# Patient Record
Sex: Male | Born: 1985
Health system: Southern US, Community
[De-identification: ages and names within clinical notes are randomized; demographics above are authoritative.]

## PROBLEM LIST (undated history)

## (undated) DIAGNOSIS — F17200 Nicotine dependence, unspecified, uncomplicated: Secondary | ICD-10-CM

## (undated) DIAGNOSIS — F419 Anxiety disorder, unspecified: Secondary | ICD-10-CM

## (undated) DIAGNOSIS — I10 Essential (primary) hypertension: Secondary | ICD-10-CM

## (undated) HISTORY — DX: Nicotine dependence, unspecified, uncomplicated: F17.200

## (undated) HISTORY — DX: Essential (primary) hypertension: I10

## (undated) HISTORY — PX: WISDOM TOOTH EXTRACTION: SHX21

---
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
## 2009-01-06 ENCOUNTER — Telehealth: Payer: Self-pay | Admitting: Family Medicine

## 2009-01-06 ENCOUNTER — Ambulatory Visit: Payer: Self-pay | Admitting: Family Medicine

## 2009-01-06 DIAGNOSIS — M545 Low back pain: Secondary | ICD-10-CM

## 2009-01-06 DIAGNOSIS — F17200 Nicotine dependence, unspecified, uncomplicated: Secondary | ICD-10-CM | POA: Insufficient documentation

## 2009-01-06 DIAGNOSIS — E669 Obesity, unspecified: Secondary | ICD-10-CM

## 2009-09-30 ENCOUNTER — Ambulatory Visit: Payer: Self-pay | Admitting: Family Medicine

## 2009-09-30 DIAGNOSIS — L723 Sebaceous cyst: Secondary | ICD-10-CM

## 2014-03-17 ENCOUNTER — Encounter: Payer: Self-pay | Admitting: Family Medicine

## 2014-03-17 ENCOUNTER — Telehealth: Payer: Self-pay | Admitting: Family Medicine

## 2014-03-17 ENCOUNTER — Ambulatory Visit (INDEPENDENT_AMBULATORY_CARE_PROVIDER_SITE_OTHER): Payer: BC Managed Care – PPO | Admitting: Family Medicine

## 2014-03-17 VITALS — BP 172/102 | HR 92 | Temp 98.0°F | Ht 71.5 in | Wt 264.2 lb

## 2014-03-17 DIAGNOSIS — R5383 Other fatigue: Secondary | ICD-10-CM

## 2014-03-17 DIAGNOSIS — R5381 Other malaise: Secondary | ICD-10-CM

## 2014-03-17 DIAGNOSIS — E669 Obesity, unspecified: Secondary | ICD-10-CM | POA: Insufficient documentation

## 2014-03-17 DIAGNOSIS — Z0000 Encounter for general adult medical examination without abnormal findings: Secondary | ICD-10-CM

## 2014-03-17 DIAGNOSIS — Z13220 Encounter for screening for lipoid disorders: Secondary | ICD-10-CM

## 2014-03-17 DIAGNOSIS — I10 Essential (primary) hypertension: Secondary | ICD-10-CM

## 2014-03-17 LAB — CBC WITH DIFFERENTIAL/PLATELET
Basophils Absolute: 0.1 10*3/uL (ref 0.0–0.1)
Basophils Relative: 0.6 % (ref 0.0–3.0)
Eosinophils Absolute: 0.4 10*3/uL (ref 0.0–0.7)
Eosinophils Relative: 3.9 % (ref 0.0–5.0)
HCT: 41 % (ref 39.0–52.0)
Hemoglobin: 13.8 g/dL (ref 13.0–17.0)
Lymphocytes Relative: 28.6 % (ref 12.0–46.0)
Lymphs Abs: 2.9 10*3/uL (ref 0.7–4.0)
MCHC: 33.7 g/dL (ref 30.0–36.0)
MCV: 85.7 fl (ref 78.0–100.0)
Monocytes Absolute: 0.6 10*3/uL (ref 0.1–1.0)
Monocytes Relative: 5.9 % (ref 3.0–12.0)
Neutro Abs: 6.3 10*3/uL (ref 1.4–7.7)
Neutrophils Relative %: 61 % (ref 43.0–77.0)
Platelets: 264 10*3/uL (ref 150.0–400.0)
RBC: 4.79 Mil/uL (ref 4.22–5.81)
RDW: 13.4 % (ref 11.5–14.6)
WBC: 10.3 10*3/uL (ref 4.5–10.5)

## 2014-03-17 LAB — BASIC METABOLIC PANEL
BUN: 16 mg/dL (ref 6–23)
CO2: 29 mEq/L (ref 19–32)
Calcium: 9.6 mg/dL (ref 8.4–10.5)
Chloride: 103 mEq/L (ref 96–112)
Creatinine, Ser: 1.3 mg/dL (ref 0.4–1.5)
GFR: 73.78 mL/min (ref >60.00)
Glucose, Bld: 130 mg/dL — ABNORMAL HIGH (ref 70–99)
Potassium: 4.6 mEq/L (ref 3.5–5.1)
Sodium: 139 mEq/L (ref 135–145)

## 2014-03-17 LAB — LIPID PANEL
Cholesterol: 148 mg/dL (ref 0–200)
HDL: 32.6 mg/dL — ABNORMAL LOW (ref >39.00)
LDL Cholesterol: 61 mg/dL (ref 0–99)
Total CHOL/HDL Ratio: 5
Triglycerides: 270 mg/dL — ABNORMAL HIGH (ref 0.0–149.0)
VLDL: 54 mg/dL — ABNORMAL HIGH (ref 0.0–40.0)

## 2014-03-17 LAB — HEPATIC FUNCTION PANEL
ALT: 23 U/L (ref 0–53)
AST: 32 U/L (ref 0–37)
Albumin: 4.5 g/dL (ref 3.5–5.2)
Alkaline Phosphatase: 86 U/L (ref 39–117)
Bilirubin, Direct: 0 mg/dL (ref 0.0–0.3)
Total Bilirubin: 0.4 mg/dL (ref 0.3–1.2)
Total Protein: 7.4 g/dL (ref 6.0–8.3)

## 2014-03-17 MED ORDER — LISINOPRIL-HYDROCHLOROTHIAZIDE 10-12.5 MG PO TABS: 1.0000 | ORAL_TABLET | Freq: Every day | ORAL | Status: DC

## 2014-03-17 NOTE — Progress Notes (Signed)
Date:  03/17/2014   Name:  Vincent Murray   DOB:  Jan 12, 1986   MRN:  [id number] Gender: male Age: 28 y.o.  Primary Physician:  Hannah Beat, MD   Chief Complaint: Establish Care   Subjective:   History of Present Illness:  Vincent Murray is a 28 y.o. pleasant patient who presents with the following:  Preventative Health Maintenance Visit:  Health Maintenance Summary Reviewed and updated, unless pt declines services.  Tobacco History Reviewed. Alcohol: No concerns, no excessive use Exercise Habits: rare STD concerns: no risk or activity to increase risk Drug Use: None Encouraged self-testicular check  Health Maintenance  Topic Date Due  . Tetanus/tdap  06/17/2006  . Influenza Vaccine  07/31/2013    Immunization History  Administered Date(s) Administered  . Td 12/31/2004   Blood pressure is high and has been high in high school. Smoking, stopped yesterday.  BP: 172/102 mmHg   Snores, denies daytime sleepiness  Patient Active Problem List   Diagnosis Date Noted  . Hypertension 03/17/2014  . Obesity (BMI 30-39.9) 03/17/2014  . SEBACEOUS CYST, NECK 09/30/2009  . OBESITY 01/06/2009  . TOBACCO USE 01/06/2009  . LOW BACK PAIN, CHRONIC 01/06/2009    No past medical history on file.  No past surgical history on file.  History   Social History  . Marital Status: Married    Spouse Name: N/A    Number of Children: N/A  . Years of Education: N/A   Occupational History  . Not on file.   Social History Main Topics  . Smoking status: Former Games developer  . Smokeless tobacco: Not on file     Comment: stopped smoking yesterday  . Alcohol Use: Yes     Comment: rare  . Drug Use: No  . Sexual Activity: Not on file   Other Topics Concern  . Not on file   Social History Narrative  . No narrative on file    Family History  Problem Relation Age of Onset  . Stroke Maternal Grandfather   . Heart disease Maternal Grandfather   . Hypertension Other      runs in family    Allergies  Allergen Reactions  . Penicillins     REACTION: rash (ALL CILLINS)    Medication list has been reviewed and updated.  Review of Systems:  General: Denies fever, chills, sweats. No significant weight loss. Eyes: Denies blurring,significant itching ENT: Denies earache, sore throat, and hoarseness. Cardiovascular: Denies chest pains, palpitations, dyspnea on exertion Respiratory: Denies cough, dyspnea at rest,wheeezing Breast: no concerns about lumps GI: Denies nausea, vomiting, diarrhea, constipation, change in bowel habits, abdominal pain, melena, hematochezia GU: Denies penile discharge, ED, urinary flow / outflow problems. No STD concerns. Musculoskeletal: recent back pain Derm: Denies rash, itching Neuro: Denies  paresthesias, frequent falls, frequent headaches Psych: Denies depression, anxiety Endocrine: Denies cold intolerance, heat intolerance, polydipsia Heme: Denies enlarged lymph nodes Allergy: No hayfever  Objective:   Physical Examination: BP 172/102  Pulse 92  Temp(Src) 98 F (36.7 C) (Oral)  Ht 5' 11.5" (1.816 m)  Wt 264 lb 4 oz (119.863 kg)  BMI 36.35 kg/m2  SpO2 97% Ideal Body Weight: Weight in (lb) to have BMI = 25: 181.4  GEN: well developed, well nourished, no acute distress Eyes: conjunctiva and lids normal, PERRLA, EOMI ENT: TM clear, nares clear, oral exam WNL Neck: supple, no lymphadenopathy, no thyromegaly, no JVD Pulm: clear to auscultation and percussion, respiratory effort normal CV: regular rate and rhythm, S1-S2,  no murmur, rub or gallop, no bruits, peripheral pulses normal and symmetric, no cyanosis, clubbing, edema or varicosities GI: soft, non-tender; no hepatosplenomegaly, masses; active bowel sounds all quadrants GU: defer Lymph: no cervical, axillary or inguinal adenopathy MSK: gait normal, muscle tone and strength WNL, no joint swelling, effusions, discoloration, crepitus  SKIN: clear, good turgor,  color WNL, no rashes, lesions, or ulcerations Neuro: normal mental status, normal strength, sensation, and motion Psych: alert; oriented to person, place and time, normally interactive and not anxious or depressed in appearance.  All labs pending  Assessment & Plan:   Health Maintenance Exam: The patient's preventative maintenance and recommended screening tests for an annual wellness exam were reviewed in full today. Brought up to date unless services declined.  Counselled on the importance of diet, exercise, and its role in overall health and mortality. The patient's FH and SH was reviewed, including their home life, tobacco status, and drug and alcohol status.  Routine general medical examination at a health care facility  Hypertension - Plan: Basic metabolic panel, US Renal Artery Stenosis  Screening for lipoid disorders - Plan: Lipid panel  Other malaise and fatigue - Plan: CBC with Differential, Hepatic function panel  Start BP meds Given high BP in young male, check U/S to eval for RAS  F/u 2 weeks  Orders Placed This Encounter  Procedures  . US Renal Artery Stenosis  . Basic metabolic panel  . CBC with Differential  . Hepatic function panel  . Lipid panel   Patient's Medications  New Prescriptions   LISINOPRIL-HYDROCHLOROTHIAZIDE (PRINZIDE,ZESTORETIC) 10-12.5 MG PER TABLET    Take 1 tablet by mouth daily.  Previous Medications   CLINDAMYCIN (CLEOCIN) 150 MG CAPSULE    Take 1 capsule by mouth 4 (four) times daily.   HAWTHORN PO    Take 1 tablet by mouth 4 (four) times daily.    HYDROCODONE-ACETAMINOPHEN (NORCO) 7.5-325 MG PER TABLET    Take 1 tablet by mouth as needed.   NON FORMULARY    Take 1 tablet by mouth daily. CREAFORM  Modified Medications   No medications on file  Discontinued Medications   No medications on file   Patient Instructions  REFERRALS TO SPECIALISTS, SPECIAL TESTS (MRI, CT, ULTRASOUNDS)  GO THE WAITING ROOM AND TELL CHECK IN YOU NEED HELP  WITH A REFERRAL. Either MARION or LINDA will help you set it up.  If it is between 1-2 PM they may be at lunch.  After 5 PM, they will likely be at home.  They will call you, so please make sure the office has your correct phone number.  Referrals sometimes can be done same day if urgent, but others can take 2 or 3 days to get an appointment. Starting in 2015, many of the new Medicare insurance plans and Affordable Care Act (Obamacare) Health plans offered take much longer for referrals. They have added additional paperwork and steps.  MRI's and CT's can take up to a week for the test. (Emergencies like strokes take precedence. I will tell you if you have an emergency.)   If [facility] [place] office, their office may contact you directly prior to our office reaching you.  -- Examples: [facility], [facility], [facility], [facility], [facility], and many more.  Specialist appointment times vary a great deal, mostly on the specialist's schedule and if they have openings. -- Our  office tries to get you in as fast as possible. -- Some specialists have very long wait times. (Example. Dermatology. Usually months) -- If you have a true emergency like new cancer, we work to get you in ASAP.     Signed,  Elpidio Galea. Tiearra Colwell, MD, CAQ Sports Medicine  [employer] at [facility] [address] [place] [address] Phone: [phone] Fax: [phone]

## 2014-03-17 NOTE — Patient Instructions (Signed)
REFERRALS TO SPECIALISTS, SPECIAL TESTS (MRI, CT, ULTRASOUNDS)  GO THE WAITING ROOM AND TELL CHECK IN YOU NEED HELP WITH A REFERRAL. Either MARION or LINDA will help you set it up.  If it is between 1-2 PM they may be at lunch.  After 5 PM, they will likely be at home.  They will call you, so please make sure the office has your correct phone number.  Referrals sometimes can be done same day if urgent, but others can take 2 or 3 days to get an appointment. Starting in 2015, many of the new Medicare insurance plans and Affordable Care Act (Obamacare) Health plans offered take much longer for referrals. They have added additional paperwork and steps.  MRI's and CT's can take up to a week for the test. (Emergencies like strokes take precedence. I will tell you if you have an emergency.)   If your referral is to an in-network [place] office, their office may contact you directly prior to our office reaching you.  -- Examples: [facility], [facility], [facility], [facility], [facility], and many more.  Specialist appointment times vary a great deal, mostly on the specialist's schedule and if they have openings. -- Our office tries to get you in as fast as possible. -- Some specialists have very long wait times. (Example. Dermatology. Usually months) -- If you have a true emergency like new cancer, we work to get you in ASAP.   

## 2014-03-17 NOTE — Addendum Note (Signed)
Addended by: Damita LackLORING, Ashby Leflore S on: 03/17/2014 12:09 PM   Modules accepted: Orders

## 2014-03-17 NOTE — Progress Notes (Signed)
Pre visit review using our clinic review tool, if applicable. No additional management support is needed unless otherwise documented below in the visit note. 

## 2014-03-17 NOTE — Telephone Encounter (Signed)
Relevant patient education mailed to patient.  

## 2014-03-25 ENCOUNTER — Encounter (INDEPENDENT_AMBULATORY_CARE_PROVIDER_SITE_OTHER): Payer: BC Managed Care – PPO

## 2014-03-25 DIAGNOSIS — I10 Essential (primary) hypertension: Secondary | ICD-10-CM

## 2014-03-31 ENCOUNTER — Encounter: Payer: Self-pay | Admitting: Family Medicine

## 2014-03-31 ENCOUNTER — Ambulatory Visit (INDEPENDENT_AMBULATORY_CARE_PROVIDER_SITE_OTHER): Payer: BC Managed Care – PPO | Admitting: Family Medicine

## 2014-03-31 VITALS — BP 146/88 | HR 70 | Temp 97.8°F | Ht 71.5 in | Wt 262.8 lb

## 2014-03-31 DIAGNOSIS — I10 Essential (primary) hypertension: Secondary | ICD-10-CM

## 2014-03-31 DIAGNOSIS — G4733 Obstructive sleep apnea (adult) (pediatric): Secondary | ICD-10-CM

## 2014-03-31 MED ORDER — LISINOPRIL-HYDROCHLOROTHIAZIDE 20-25 MG PO TABS: 1.0000 | ORAL_TABLET | Freq: Every day | ORAL | Status: DC

## 2014-03-31 NOTE — Patient Instructions (Signed)
REFERRALS TO SPECIALISTS, SPECIAL TESTS (MRI, CT, ULTRASOUNDS)  GO THE WAITING ROOM AND TELL CHECK IN YOU NEED HELP WITH A REFERRAL. Either MARION or LINDA will help you set it up.  If it is between 1-2 PM they may be at lunch.  After 5 PM, they will likely be at home.  They will call you, so please make sure the office has your correct phone number.  Referrals sometimes can be done same day if urgent, but others can take 2 or 3 days to get an appointment. Starting in 2015, many of the new Medicare insurance plans and Affordable Care Act (Obamacare) Health plans offered take much longer for referrals. They have added additional paperwork and steps.  MRI's and CT's can take up to a week for the test. (Emergencies like strokes take precedence. I will tell you if you have an emergency.)   If your referral is to an in-network [place] office, their office may contact you directly prior to our office reaching you.  -- Examples: [facility], [facility], [facility], [facility], [facility], and many more.  Specialist appointment times vary a great deal, mostly on the specialist's schedule and if they have openings. -- Our office tries to get you in as fast as possible. -- Some specialists have very long wait times. (Example. Dermatology. Usually months) -- If you have a true emergency like new cancer, we work to get you in ASAP.   

## 2014-03-31 NOTE — Progress Notes (Signed)
Date:  03/31/2014   Name:  Vincent Murray   DOB:  1986/06/01   MRN:  [id number]  Primary Physician:  Hannah Beat, MD   Chief Complaint: Hypertension   Subjective:   History of Present Illness:  Vincent Murray is a 28 y.o. very pleasant male patient who presents with the following:  HTN: Tolerating all medications without side effects Improved, not at goal No CP, no sob. No HA.  BP Readings from Last 3 Encounters:  03/31/14 146/88  03/17/14 172/102  09/30/09 128/76    Basic Metabolic Panel:    Component Value Date/Time   NA 139 03/17/2014 0855   K 4.6 03/17/2014 0855   CL 103 03/17/2014 0855   CO2 29 03/17/2014 0855   BUN 16 03/17/2014 0855   CREATININE 1.3 03/17/2014 0855   GLUCOSE 130* 03/17/2014 0855   CALCIUM 9.6 03/17/2014 0855    Still tired a lot.  Still ? Stops breathing. Snores a lot. Girlfriend notes that he stops breathing at night.  Tired most of the time.  Past Medical History, Surgical History, Social History, Family History, Problem List, Medications, and Allergies have been reviewed and updated if relevant.  Review of Systems:  GEN: No acute illnesses, no fevers, chills. GI: No n/v/d, eating normally Pulm: No SOB Interactive and getting along well at home.  Otherwise, ROS is as per the HPI.  Objective:   Physical Examination: BP 146/88  Pulse 70  Temp(Src) 97.8 F (36.6 C) (Oral)  Ht 5' 11.5" (1.816 m)  Wt 262 lb 12 oz (119.183 kg)  BMI 36.14 kg/m2   GEN: WDWN, NAD, Non-toxic, A & O x 3 HEENT: Atraumatic, Normocephalic. Neck supple. No masses, No LAD. Ears and Nose: No external deformity. CV: RRR, No M/G/R. No JVD. No thrill. No extra heart sounds. PULM: CTA B, no wheezes, crackles, rhonchi. No retractions. No resp. distress. No accessory muscle use. EXTR: No c/c/e NEURO Normal gait.  PSYCH: Normally interactive. Conversant. Not depressed or anxious appearing.  Calm demeanor.   Laboratory and Imaging Data:  Assessment &  Plan:   Hypertension  Obstructive sleep apnea - Plan: Ambulatory referral to Pulmonology  Blood pressure is stabilizing, but it is still not at goal. Increase Zestoretic dose.  History is suggestive of obstructive sleep apnea. Witnessed apneic events by his girlfriend, and snoring heavily in a patient with a BMI of 36.  Return in about 3 weeks (around 04/21/2014).  Orders Placed This Encounter  Procedures  . Ambulatory referral to Pulmonology   Patient's Medications  New Prescriptions   LISINOPRIL-HYDROCHLOROTHIAZIDE (PRINZIDE,ZESTORETIC) 20-25 MG PER TABLET    Take 1 tablet by mouth daily.  Previous Medications   HAWTHORN PO    Take 1 tablet by mouth 4 (four) times daily.    HYDROCODONE-ACETAMINOPHEN (NORCO) 7.5-325 MG PER TABLET    Take 1 tablet by mouth as needed.   NON FORMULARY    Take 1 tablet by mouth daily. CREAFORM  Modified Medications   No medications on file  Discontinued Medications   CLINDAMYCIN (CLEOCIN) 150 MG CAPSULE    Take 1 capsule by mouth 4 (four) times daily.   LISINOPRIL-HYDROCHLOROTHIAZIDE (PRINZIDE,ZESTORETIC) 10-12.5 MG PER TABLET    Take 1 tablet by mouth daily.   Patient Instructions  REFERRALS TO SPECIALISTS, SPECIAL TESTS (MRI, CT, ULTRASOUNDS)  GO THE WAITING ROOM AND TELL CHECK IN YOU NEED HELP WITH A REFERRAL. Either MARION or LINDA will help you set it up.  If it is between  1-2 PM they may be at lunch.  After 5 PM, they will likely be at home.  They will call you, so please make sure the office has your correct phone number.  Referrals sometimes can be done same day if urgent, but others can take 2 or 3 days to get an appointment. Starting in 2015, many of the new Medicare insurance plans and Affordable Care Act (Obamacare) Health plans offered take much longer for referrals. They have added additional paperwork and steps.  MRI's and CT's can take up to a week for the test. (Emergencies like strokes take precedence. I will tell you if you  have an emergency.)   If your referral [facility] to an [facility] [place] office, their office may contact you directly prior to our office reaching you.  -- Examples: [facility], [place] [facility], [facility], [facility], [facility], and many more.  Specialist appointment times vary a great deal, mostly on the specialist's schedule and if they have openings. -- Our office tries to get you in as fast as possible. -- Some specialists have very long wait times. (Example. Dermatology. Usually months) -- If you have a true emergency like new cancer, we work to get you in ASAP.     Signed,  Elpidio GaleaSpencer T. Jady Braggs, MD, CAQ Sports Medicine  [facility] [address] [place] [address] Phone: [phone] Fax: [phone]

## 2014-03-31 NOTE — Progress Notes (Signed)
Pre visit review using our clinic review tool, if applicable. No additional management support is needed unless otherwise documented below in the visit note. 

## 2014-04-26 ENCOUNTER — Telehealth: Payer: Self-pay | Admitting: Family Medicine

## 2014-04-26 ENCOUNTER — Ambulatory Visit (INDEPENDENT_AMBULATORY_CARE_PROVIDER_SITE_OTHER): Payer: BC Managed Care – PPO | Admitting: Family Medicine

## 2014-04-26 ENCOUNTER — Encounter: Payer: Self-pay | Admitting: Family Medicine

## 2014-04-26 VITALS — BP 136/70 | HR 84 | Temp 98.2°F | Wt 256.0 lb

## 2014-04-26 DIAGNOSIS — N529 Male erectile dysfunction, unspecified: Secondary | ICD-10-CM

## 2014-04-26 DIAGNOSIS — T50904A Poisoning by unspecified drugs, medicaments and biological substances, undetermined, initial encounter: Secondary | ICD-10-CM

## 2014-04-26 DIAGNOSIS — N522 Drug-induced erectile dysfunction: Secondary | ICD-10-CM

## 2014-04-26 DIAGNOSIS — I10 Essential (primary) hypertension: Secondary | ICD-10-CM

## 2014-04-26 MED ORDER — AMLODIPINE BESYLATE 5 MG PO TABS: 5.0000 mg | ORAL_TABLET | Freq: Every day | ORAL | Status: DC

## 2014-04-26 MED ORDER — LISINOPRIL 40 MG PO TABS: 40.0000 mg | ORAL_TABLET | Freq: Every day | ORAL | Status: DC

## 2014-04-26 NOTE — Progress Notes (Signed)
[address] [place] [address] Phone: [phone] Fax: [phone]  Patient ID: Vincent Murray MRN: [id number], DOB: [birth year], 28 y.o. Date of Encounter: 04/26/2014  Primary Physician:  Hannah BeatSpencer Bekim Werntz, MD   Chief Complaint: Follow-up   Subjective:   History of Present Illness:  Vincent Murray is a 28 y.o. very pleasant male patient who presents with the following:  HTN: Tolerating all medications but he is having some ED that started right after increasing her meds.  Stable and at goal No CP, no sob. No HA.  BP Readings from Last 3 Encounters:  04/26/14 136/70  03/31/14 146/88  03/17/14 172/102    Basic Metabolic Panel:    Component Value Date/Time   NA 139 03/17/2014 0855   K 4.6 03/17/2014 0855   CL 103 03/17/2014 0855   CO2 29 03/17/2014 0855   BUN 16 03/17/2014 0855   CREATININE 1.3 03/17/2014 0855   GLUCOSE 130* 03/17/2014 0855   CALCIUM 9.6 03/17/2014 0855    After we doubled the dose of the lisinopril hydrochlorothiazide, the patient was unable to achieve an erection, which has never been a problem for him in the past.  Past Medical History, Surgical History, Social History, Family History, Problem List, Medications, and Allergies have been reviewed and updated if relevant.  Review of Systems:  GEN: No acute illnesses, no fevers, chills. GI: No n/v/d, eating normally Pulm: No SOB Interactive and getting along well at home.  Otherwise, ROS is as per the HPI.  Objective:   Physical Examination: BP 136/70  Pulse 84  Temp(Src) 98.2 F (36.8 C) (Oral)  Wt 256 lb (116.121 kg)  SpO2 99%   GEN: WDWN, NAD, Non-toxic, A & O x 3 HEENT: Atraumatic, Normocephalic. Neck supple. No masses, No LAD. Ears and Nose: No external deformity. CV: RRR, No M/G/R. No JVD. No thrill. No extra heart sounds. PULM: CTA B, no wheezes, crackles, rhonchi. No retractions. No resp. distress. No accessory muscle use. EXTR: No c/c/e NEURO Normal gait.  PSYCH:  Normally interactive. Conversant. Not depressed or anxious appearing.  Calm demeanor.   Laboratory and Imaging Data: Results for orders placed in visit on 03/17/14  BASIC METABOLIC PANEL      Result Value Ref Range   Sodium 139  135 - 145 mEq/L   Potassium 4.6  3.5 - 5.1 mEq/L   Chloride 103  96 - 112 mEq/L   CO2 29  19 - 32 mEq/L   Glucose, Bld 130 (*) 70 - 99 mg/dL   BUN 16  6 - 23 mg/dL   Creatinine, Ser 1.3  0.4 - 1.5 mg/dL   Calcium 9.6  8.4 - 13.210.5 mg/dL   GFR 44.0173.78  >02.72>60.00 mL/min  CBC WITH DIFFERENTIAL      Result Value Ref Range   WBC 10.3  4.5 - 10.5 K/uL   RBC 4.79  4.22 - 5.81 Mil/uL   Hemoglobin 13.8  13.0 - 17.0 g/dL   HCT 53.641.0  64.439.0 - 03.452.0 %   MCV 85.7  78.0 - 100.0 fl   MCHC 33.7  30.0 - 36.0 g/dL   RDW 74.213.4  59.511.5 - 63.814.6 %   Platelets 264.0  150.0 - 400.0 K/uL   Neutrophils Relative % 61.0  43.0 - 77.0 %   Lymphocytes Relative 28.6  12.0 - 46.0 %   Monocytes Relative 5.9  3.0 - 12.0 %   Eosinophils Relative 3.9  0.0 - 5.0 %   Basophils Relative 0.6  0.0 - 3.0 %   Neutro Abs 6.3  1.4 - 7.7 K/uL   Lymphs Abs 2.9  0.7 - 4.0 K/uL   Monocytes Absolute 0.6  0.1 - 1.0 K/uL   Eosinophils Absolute 0.4  0.0 - 0.7 K/uL   Basophils Absolute 0.1  0.0 - 0.1 K/uL  HEPATIC FUNCTION PANEL      Result Value Ref Range   Total Bilirubin 0.4  0.3 - 1.2 mg/dL   Bilirubin, Direct 0.0  0.0 - 0.3 mg/dL   Alkaline Phosphatase 86  39 - 117 U/L   AST 32  0 - 37 U/L   ALT 23  0 - 53 U/L   Total Protein 7.4  6.0 - 8.3 g/dL   Albumin 4.5  3.5 - 5.2 g/dL  LIPID PANEL      Result Value Ref Range   Cholesterol 148  0 - 200 mg/dL   Triglycerides 161.0270.0 (*) 0.0 - 149.0 mg/dL   HDL 96.0432.60 (*) >54.09>39.00 mg/dL   VLDL 81.154.0 (*) 0.0 - 91.440.0 mg/dL   LDL Cholesterol 61  0 - 99 mg/dL   Total CHOL/HDL Ratio 5       Assessment & Plan:   Hypertension  Drug-induced erectile dysfunction  Discontinue hydrochlorothiazide and increase lisinopril to 40 mg a day. Likely the diuretic Clinoril in his  erectile dysfunction. There would not be any other obvious cause.  Follow-up: Return in about 1 month (around 05/26/2014).  New Prescriptions   AMLODIPINE (NORVASC) 5 MG TABLET    Take 1 tablet (5 mg total) by mouth daily.   LISINOPRIL (PRINIVIL,ZESTRIL) 40 MG TABLET    Take 1 tablet (40 mg total) by mouth daily.   No orders of the defined types were placed in this encounter.    Signed,  Elpidio GaleaSpencer T. Nilay Mangrum, MD, CAQ Sports Medicine  Patient's Medications  New Prescriptions   AMLODIPINE (NORVASC) 5 MG TABLET    Take 1 tablet (5 mg total) by mouth daily.   LISINOPRIL (PRINIVIL,ZESTRIL) 40 MG TABLET    Take 1 tablet (40 mg total) by mouth daily.  Previous Medications   HYDROCODONE-ACETAMINOPHEN (NORCO) 7.5-325 MG PER TABLET    Take 1 tablet by mouth as needed.   NON FORMULARY    Take 1 tablet by mouth daily. CREAFORM  Modified Medications   No medications on file  Discontinued Medications   HAWTHORN PO    Take 1 tablet by mouth 4 (four) times daily.    LISINOPRIL-HYDROCHLOROTHIAZIDE (PRINZIDE,ZESTORETIC) 20-25 MG PER TABLET    Take 1 tablet by mouth daily.

## 2014-04-26 NOTE — Telephone Encounter (Signed)
Relevant patient education mailed to patient.  

## 2014-04-26 NOTE — Progress Notes (Signed)
Pre visit review using our clinic review tool, if applicable. No additional management support is needed unless otherwise documented below in the visit note. 

## 2014-05-07 ENCOUNTER — Ambulatory Visit (INDEPENDENT_AMBULATORY_CARE_PROVIDER_SITE_OTHER): Payer: BC Managed Care – PPO | Admitting: Internal Medicine

## 2014-05-07 ENCOUNTER — Encounter: Payer: Self-pay | Admitting: Internal Medicine

## 2014-05-07 VITALS — BP 130/78 | HR 71 | Ht 71.0 in | Wt 263.4 lb

## 2014-05-07 DIAGNOSIS — G4733 Obstructive sleep apnea (adult) (pediatric): Secondary | ICD-10-CM | POA: Insufficient documentation

## 2014-05-07 DIAGNOSIS — F17200 Nicotine dependence, unspecified, uncomplicated: Secondary | ICD-10-CM

## 2014-05-07 DIAGNOSIS — E669 Obesity, unspecified: Secondary | ICD-10-CM

## 2014-05-07 DIAGNOSIS — I10 Essential (primary) hypertension: Secondary | ICD-10-CM

## 2014-05-07 NOTE — Assessment & Plan Note (Signed)
"  Stopped yesterday"  Encouraged to stop permanently

## 2014-05-07 NOTE — Assessment & Plan Note (Signed)
High probability. The concern of his primary physician is that OSA may contribute to HBP. We discussed the physiology and medical concerns, importance of weight, and responsibility to drive safely, importance of good sleep habits. Plan- Unattended home sleep study if insurance agrees, otherwise overnight split study at sleep center.

## 2014-05-07 NOTE — Assessment & Plan Note (Signed)
Managed by PCP

## 2014-05-07 NOTE — Progress Notes (Signed)
05/07/14- 27 yoM smoker Follows for :  sleep consult per Dr. Copland---pt snoring at night, apnea when lying on his back Snored more loudly before he lost 20 lbs working out. Worse on his back. Oppressive daytime tiredness. If he can nap, he sleeps hard for an hour and is grumpy rest of day. No ENT surgery. Concern is basis for his hypertension. May talk in sleep, but no movement disturbance. Asthma as child. No other cardiopulmonary problem.Bedtime 10-12 PM, latency 10-60 min, waking 0-1 before up at 8-8:30 AM.  Prior to Admission medications   Medication Sig Start Date End Date Taking? Authorizing Provider  amLODipine (NORVASC) 5 MG tablet Take 1 tablet (5 mg total) by mouth daily. 04/26/14  Yes Spencer Copland, MD  HYDROcodone-acetaminophen (NORCO) 7.5-325 MG per tablet Take 1 tablet by mouth as needed. 03/10/14  Yes Historical Provider, MD  lisinopril (PRINIVIL,ZESTRIL) 40 MG tablet Take 1 tablet (40 mg total) by mouth daily. 04/26/14  Yes Hannah BeatSpencer Copland, MD  NON FORMULARY Take 1 tablet by mouth daily. CREAFORM   Yes Historical Provider, MD   Past Medical History  Diagnosis Date  . Hypertension   . Smoker    History reviewed. No pertinent past surgical history. Family History  Problem Relation Age of Onset  . Stroke Maternal Grandfather   . Heart disease Maternal Grandfather   . Hypertension Other     runs in family   History   Social History  . Marital Status: Married    Spouse Name: Aundra MilletMegan     Number of Children: N/A  . Years of Education: N/A   Occupational History  .     Social History Main Topics  . Smoking status: Current Some Day Smoker -- 1.00 packs/day    Types: Cigarettes  . Smokeless tobacco: Former NeurosurgeonUser     Comment: stopped smoking yesterday  . Alcohol Use: Yes     Comment: rare  . Drug Use: No  . Sexual Activity: Not on file   Other Topics Concern  . Not on file   Social History Narrative  . No narrative on file   ROS-see HPI Constitutional:   +weight  loss, No-night sweats, fevers, chills,+ fatigue, lassitude. HEENT:   No-  headaches, difficulty swallowing, tooth/dental problems, sore throat,       No-  sneezing, itching, ear ache, nasal congestion, post nasal drip,  CV:  No-   chest pain, orthopnea, PND, swelling in lower extremities, anasarca,                                  dizziness, palpitations Resp: No-   shortness of breath with exertion or at rest.              No-   productive cough,  No non-productive cough,  No- coughing up of blood.              No-   change in color of mucus.  No- wheezing.   Skin: No-   rash or lesions. GI:  No-   heartburn, indigestion, abdominal pain, nausea, vomiting, diarrhea,                 change in bowel habits, loss of appetite GU: No-   dysuria, change in color of urine, no urgency or frequency.  No- flank pain. MS:  No-   joint pain or swelling.  No- decreased range of motion.  No-  back pain. Neuro-     nothing unusual Psych:  No- change in mood or affect. No depression or anxiety.  No memory loss.  OBJ- Physical Exam General- Alert, Oriented, Affect-appropriate, Distress- none acute, +stocky/ muscular Skin- rash-none, lesions- none, excoriation- none Lymphadenopathy- none Head- atraumatic            Eyes- Gross vision intact, PERRLA, conjunctivae and secretions clear            Ears- Hearing, canals-normal            Nose- Clear, no-Septal dev, mucus, polyps, erosion, perforation             Throat- Mallampati II-III , mucosa clear , drainage- none, tonsils- atrophic Neck- flexible , trachea midline, no stridor , thyroid nl, carotid no bruit Chest - symmetrical excursion , unlabored           Heart/CV- RRR , no murmur , no gallop  , no rub, nl s1 s2                           - JVD- none , edema- none, stasis changes- none, varices- none           Lung- clear to P&A, wheeze- none, cough- none , dullness-none, rub- none           Chest wall-  Abd- tender-no, distended-no, bowel  sounds-present, HSM- no Br/ Gen/ Rectal- Not done, not indicated Extrem- cyanosis- none, clubbing, none, atrophy- none, strength- nl Neuro- grossly intact to observation

## 2014-05-07 NOTE — Patient Instructions (Signed)
Order- Home unattended sleep study    Dx OSA  We will go over results at the return visit

## 2014-05-07 NOTE — Assessment & Plan Note (Signed)
Over best weight- not all muscle

## 2014-05-26 ENCOUNTER — Ambulatory Visit (INDEPENDENT_AMBULATORY_CARE_PROVIDER_SITE_OTHER): Payer: BC Managed Care – PPO | Admitting: Family Medicine

## 2014-05-26 ENCOUNTER — Encounter: Payer: Self-pay | Admitting: Family Medicine

## 2014-05-26 VITALS — BP 130/76 | HR 59 | Temp 98.2°F | Ht 71.5 in | Wt 262.5 lb

## 2014-05-26 DIAGNOSIS — M25519 Pain in unspecified shoulder: Secondary | ICD-10-CM

## 2014-05-26 DIAGNOSIS — I10 Essential (primary) hypertension: Secondary | ICD-10-CM

## 2014-05-26 DIAGNOSIS — M25512 Pain in left shoulder: Secondary | ICD-10-CM

## 2014-05-26 NOTE — Progress Notes (Signed)
[address] [place] [address] Phone: [phone] Fax: [phone]  Patient ID: Vincent Murray MRN: [id number], DOB: 03-13-1986, 28 y.o. Date of Encounter: 05/26/2014  Primary Physician:  Hannah Beat, MD   Chief Complaint: Follow-up and Shoulder Pain   Subjective:   History of Present Illness:  Vincent Murray is a 28 y.o. very pleasant male patient who presents with the following:  HTN: Tolerating all medications without side effects Stable and at goal No CP, no sob. No HA.  BP Readings from Last 3 Encounters:  05/26/14 130/76  05/07/14 130/78  04/26/14 136/70    Basic Metabolic Panel:    Component Value Date/Time   NA 139 03/17/2014 0855   K 4.6 03/17/2014 0855   CL 103 03/17/2014 0855   CO2 29 03/17/2014 0855   BUN 16 03/17/2014 0855   CREATININE 1.3 03/17/2014 0855   GLUCOSE 130* 03/17/2014 0855   CALCIUM 9.6 03/17/2014 0855    ED issues improved off HCTZ.   L shoulder, hurting it last Wednesday and it started to hurt really bad. And every once in a while it will catch on the lateral neck. Started to feel better, and felt ok with lifting felt a pop with incline flies.   Past Medical History, Surgical History, Social History, Family History, Problem List, Medications, and Allergies have been reviewed and updated if relevant.  Review of Systems:  GEN: No acute illnesses, no fevers, chills. GI: No n/v/d, eating normally Pulm: No SOB Interactive and getting along well at home.  Otherwise, ROS is as per the HPI.  Objective:   Physical Examination: BP 130/76  Pulse 59  Temp(Src) 98.2 F (36.8 C) (Oral)  Ht 5' 11.5" (1.816 m)  Wt 262 lb 8 oz (119.069 kg)  BMI 36.10 kg/m2  SpO2 99%   GEN: WDWN, NAD, Non-toxic, A & O x 3 HEENT: Atraumatic, Normocephalic. Neck supple. No masses, No LAD. Ears and Nose: No external deformity. CV: RRR, No M/G/R. No JVD. No thrill. No extra heart sounds. PULM: CTA B, no wheezes, crackles, rhonchi. No  retractions. No resp. distress. No accessory muscle use. EXTR: No c/c/e NEURO Normal gait.  PSYCH: Normally interactive. Conversant. Not depressed or anxious appearing.  Calm demeanor.   Left shoulder: Nontender along the clavicle. Nontender at the a.c. joint. Full range of motion. Nontender in the bicipital groove. Speech and Yergason's are negative. Neer test is negative. Leanord Asal test is moderately positive.  Negative sulcus sign. O'Briens test is positive on the left notably compared to the right.  Neck range of motion is grossly preserved. There is no inducible radiculopathy.  Laboratory and Imaging Data:  Assessment & Plan:   Hypertension  Left shoulder pain  Blood pressure is stable on medication. Erectile dysfunction was likely secondary to his previous hypertensive medication.  Left shoulder pain most likely to be pectoralis major versus minor small tear, could include trapezius as well. Highly doubt the rotator cuff involvement. Recommended range of motion, gait basic rotator cuff and scapular stabilization program from the [employer].  Signed,  Elpidio Galea. Aybree Lanyon, MD, CAQ Sports Medicine   Patient's Medications  New Prescriptions   No medications on file  Previous Medications   AMLODIPINE (NORVASC) 5 MG TABLET    Take 1 tablet (5 mg total) by mouth daily.   HYDROCODONE-ACETAMINOPHEN (NORCO) 7.5-325 MG PER TABLET    Take 1 tablet by mouth as needed.   LISINOPRIL (PRINIVIL,ZESTRIL) 40 MG TABLET  Take 1 tablet (40 mg total) by mouth daily.   NON FORMULARY    Take 1 tablet by mouth daily. CREAFORM  Modified Medications   No medications on file  Discontinued Medications   No medications on file

## 2014-05-26 NOTE — Progress Notes (Signed)
Pre visit review using our clinic review tool, if applicable. No additional management support is needed unless otherwise documented below in the visit note. 

## 2014-09-24 ENCOUNTER — Other Ambulatory Visit: Payer: Self-pay | Admitting: Family Medicine

## 2014-11-09 ENCOUNTER — Ambulatory Visit (INDEPENDENT_AMBULATORY_CARE_PROVIDER_SITE_OTHER)
Admission: RE | Admit: 2014-11-09 | Discharge: 2014-11-09 | Disposition: A | Discharge status: Home / Self Care | Payer: No Typology Code available for payment source | Source: Ambulatory Visit | Attending: Family Medicine | Admitting: Family Medicine

## 2014-11-09 ENCOUNTER — Ambulatory Visit (INDEPENDENT_AMBULATORY_CARE_PROVIDER_SITE_OTHER): Payer: No Typology Code available for payment source | Admitting: Family Medicine

## 2014-11-09 ENCOUNTER — Encounter: Payer: Self-pay | Admitting: Family Medicine

## 2014-11-09 VITALS — BP 144/60 | HR 78 | Temp 98.4°F | Wt 269.5 lb

## 2014-11-09 DIAGNOSIS — M25572 Pain in left ankle and joints of left foot: Secondary | ICD-10-CM

## 2014-11-09 NOTE — Progress Notes (Signed)
L foot pain.  Started a few days before Halloween.  Wasn't severe initially.  Then rolled L ankle the weekend of Halloween.  More pain the meantime, the last few days.  More pain in AM, better later in the day, can limp around later in the day.  No bruising, minimal swelling prev noted by wife but not by patient.  Not painful if not weight bearing.  Pain is along the L 5th MT.  Meds, vitals, and allergies reviewed.   ROS: See HPI.  Otherwise, noncontributory.  nad L shin and L foot not puffy or bruised.  NV intact.  Shin and ankle not ttp, midfoot not ttp except along the proximal 5th MT.  Pain with inversion of ankle but no pain with ROM o/w.   Normal toe ROM x5

## 2014-11-09 NOTE — Assessment & Plan Note (Signed)
No fx seen in images.  D/w pt.   Feels much better in ASO, use when weight bearing and then wean out.  Ice as needed.   He agrees. Fu prn.

## 2014-11-09 NOTE — Patient Instructions (Signed)
Take care.  Glad to see you.   Wear the brace until your ankle feels better than gradually wean out of it.  Call back as needed.

## 2014-12-09 ENCOUNTER — Telehealth: Payer: Self-pay | Admitting: Family Medicine

## 2014-12-09 NOTE — Telephone Encounter (Signed)
ok 

## 2014-12-09 NOTE — Telephone Encounter (Signed)
Patient came in to see Dr.Copland in April with high blood pressure.  Patient needed to have dental extractions and needed a letter from Dr.Copland in order to have the surgery.  Patient came in and received letter for surgery.  Patient didn't have the surgery, but now he's having pain and wants to have the surgery done.  Brandy at Dr.Belcher's office called to get the letter faxed to her.  Patient gave her the letter, but she lost it.  I put the letter on Dr.Copland's desk waiting for signature.  [facility] [phone].

## 2015-03-31 ENCOUNTER — Ambulatory Visit (INDEPENDENT_AMBULATORY_CARE_PROVIDER_SITE_OTHER): Payer: No Typology Code available for payment source | Admitting: Family Medicine

## 2015-03-31 ENCOUNTER — Encounter: Payer: Self-pay | Admitting: Family Medicine

## 2015-03-31 ENCOUNTER — Encounter: Payer: Self-pay | Admitting: *Deleted

## 2015-03-31 VITALS — BP 120/68 | HR 88 | Temp 98.4°F | Ht 71.5 in | Wt 279.2 lb

## 2015-03-31 DIAGNOSIS — R739 Hyperglycemia, unspecified: Secondary | ICD-10-CM

## 2015-03-31 DIAGNOSIS — F528 Other sexual dysfunction not due to a substance or known physiological condition: Secondary | ICD-10-CM

## 2015-03-31 DIAGNOSIS — M940 Chondrocostal junction syndrome [Tietze]: Secondary | ICD-10-CM

## 2015-03-31 DIAGNOSIS — R5383 Other fatigue: Secondary | ICD-10-CM

## 2015-03-31 DIAGNOSIS — G4733 Obstructive sleep apnea (adult) (pediatric): Secondary | ICD-10-CM

## 2015-03-31 DIAGNOSIS — R202 Paresthesia of skin: Secondary | ICD-10-CM | POA: Diagnosis not present

## 2015-03-31 DIAGNOSIS — R200 Anesthesia of skin: Secondary | ICD-10-CM

## 2015-03-31 DIAGNOSIS — F5221 Male erectile disorder: Secondary | ICD-10-CM

## 2015-03-31 LAB — HEPATIC FUNCTION PANEL
ALBUMIN: 4.5 g/dL (ref 3.5–5.2)
ALT: 24 U/L (ref 0–53)
AST: 26 U/L (ref 0–37)
Alkaline Phosphatase: 47 U/L (ref 39–117)
BILIRUBIN TOTAL: 0.5 mg/dL (ref 0.2–1.2)
Bilirubin, Direct: 0.1 mg/dL (ref 0.0–0.3)
Total Protein: 7.7 g/dL (ref 6.0–8.3)

## 2015-03-31 LAB — BASIC METABOLIC PANEL
BUN: 16 mg/dL (ref 6–23)
CALCIUM: 10 mg/dL (ref 8.4–10.5)
CO2: 28 meq/L (ref 19–32)
CREATININE: 1.17 mg/dL (ref 0.40–1.50)
Chloride: 101 mEq/L (ref 96–112)
GFR: 79.02 mL/min (ref >60.00)
Glucose, Bld: 87 mg/dL (ref 70–99)
Potassium: 4.4 mEq/L (ref 3.5–5.1)
Sodium: 134 mEq/L — ABNORMAL LOW (ref 135–145)

## 2015-03-31 LAB — CBC WITH DIFFERENTIAL/PLATELET
BASOS ABS: 0.1 10*3/uL (ref 0.0–0.1)
BASOS PCT: 0.7 % (ref 0.0–3.0)
EOS ABS: 0.5 10*3/uL (ref 0.0–0.7)
Eosinophils Relative: 5 % (ref 0.0–5.0)
HCT: 39.2 % (ref 39.0–52.0)
Hemoglobin: 13.3 g/dL (ref 13.0–17.0)
Lymphocytes Relative: 35.7 % (ref 12.0–46.0)
Lymphs Abs: 3.7 10*3/uL (ref 0.7–4.0)
MCHC: 34 g/dL (ref 30.0–36.0)
MCV: 84.5 fl (ref 78.0–100.0)
Monocytes Absolute: 1 10*3/uL (ref 0.1–1.0)
Monocytes Relative: 9.2 % (ref 3.0–12.0)
Neutro Abs: 5.2 10*3/uL (ref 1.4–7.7)
Neutrophils Relative %: 49.4 % (ref 43.0–77.0)
Platelets: 288 10*3/uL (ref 150.0–400.0)
RBC: 4.64 Mil/uL (ref 4.22–5.81)
RDW: 13.7 % (ref 11.5–15.5)
WBC: 10.5 10*3/uL (ref 4.0–10.5)

## 2015-03-31 LAB — TSH: TSH: 1.16 u[IU]/mL (ref 0.35–4.50)

## 2015-03-31 LAB — HEMOGLOBIN A1C: Hgb A1c MFr Bld: 5.8 % (ref 4.6–6.5)

## 2015-03-31 MED ORDER — SILDENAFIL CITRATE 20 MG PO TABS: ORAL_TABLET | ORAL | Status: DC

## 2015-03-31 NOTE — Progress Notes (Signed)
Dr. Karleen Hampshire T. Shanon Becvar, MD, CAQ Sports Medicine Primary Care and Sports Medicine [address] [place], [address] Phone: [phone] Fax: [phone]  03/31/2015  Patient: Vincent Murray, MRN: [id number], DOB: Jan 05, 1986, 29 y.o.  Primary Physician:  Hannah Beat, MD  Chief Complaint: Chest Pain; Fatigue; and Numbness  Subjective:   Vincent Murray is a 29 y.o. very pleasant male patient who presents with the following:  Multiple complaints.  Arms going numb intermittently - if on center console. Whatever side will sleep on, will go numb. This is intermittently, it is happening when he is driving his motorcycle occasionally, is having when he is sleeping. He does notice it is worse when his arms above his head when he is sleeping. No prior history to his ulnar nerve when he was in high school and had to wear an arm brace which resolved after approximately 6 weeks.  Had some right sided chest pain and then go away. This is intermittent, brought on by deep palpation, last various amounts of time, and it is not brought on by exertion.  Blacked out at the gym, bent over rows. Started feeling it and getting dizzy. This is not been a recurrent issue.  Still having some erection issues. Still getting erection when by himself. No problems during masturbation. He does have a problem keeping his erection when he is trying to engage in intercourse with his partner. No issues with his partner.   Wt Readings from Last 3 Encounters:  03/31/15 279 lb 4 oz (126.667 kg)  11/09/14 269 lb 8 oz (122.244 kg)  05/26/14 262 lb 8 oz (119.069 kg)    OSA test. Sleep study. He was never able to do this. This was an insurance issue. He has new insurance now. He says that he sleeps, and when he wakes up he does not feel rested at all. He can take a nap in the afternoon and will not feel well rested after this. His weight has continued to escalate, now his weight is up proximally 280 pounds.  Body mass index is 38.41 kg/(m^2).   Past Medical History, Surgical History, Social History, Family History, Problem List, Medications, and Allergies have been reviewed and updated if relevant.   GEN: No acute illnesses, no fevers, chills. GI: No n/v/d, eating normally Pulm: No SOB Interactive and getting along well at home.  Otherwise, ROS is as per the HPI.  Objective:   BP 120/68 mmHg  Pulse 88  Temp(Src) 98.4 F (36.9 C) (Oral)  Ht 5' 11.5" (1.816 m)  Wt 279 lb 4 oz (126.667 kg)  BMI 38.41 kg/m2  SpO2 97%  GEN: WDWN, NAD, Non-toxic, A & O x 3 HEENT: Atraumatic, Normocephalic. Neck supple. No masses, No LAD. Ears and Nose: No external deformity. CV: RRR, No M/G/R. No JVD. No thrill. No extra heart sounds. One are in chest wall tender to palpation medial to nipple. PULM: CTA B, no wheezes, crackles, rhonchi. No retractions. No resp. distress. No accessory muscle use. EXTR: No c/c/e NEURO Normal gait.  PSYCH: Normally interactive. Conversant. Not depressed or anxious appearing.  Calm demeanor.   Laboratory and Imaging Data: Results for orders placed or performed in visit on 03/31/15  Basic metabolic panel  Result Value Ref Range   Sodium 134 (L) 135 - 145 mEq/L   Potassium 4.4 3.5 - 5.1 mEq/L   Chloride 101 96 - 112 mEq/L   CO2 28 19 - 32 mEq/L   Glucose, Bld 87 70 -  99 mg/dL   BUN 16 6 - 23 mg/dL   Creatinine, Ser 1.611.17 0.40 - 1.50 mg/dL   Calcium 09.610.0 8.4 - 04.510.5 mg/dL   GFR 40.9879.02 >11.91>60.00 mL/min  CBC with Differential/Platelet  Result Value Ref Range   WBC 10.5 4.0 - 10.5 K/uL   RBC 4.64 4.22 - 5.81 Mil/uL   Hemoglobin 13.3 13.0 - 17.0 g/dL   HCT 47.839.2 29.539.0 - 62.152.0 %   MCV 84.5 78.0 - 100.0 fl   MCHC 34.0 30.0 - 36.0 g/dL   RDW 30.813.7 65.711.5 - 84.615.5 %   Platelets 288.0 150.0 - 400.0 K/uL   Neutrophils Relative % 49.4 43.0 - 77.0 %   Lymphocytes Relative 35.7 12.0 - 46.0 %   Monocytes Relative 9.2 3.0 - 12.0 %   Eosinophils Relative 5.0 0.0 - 5.0 %   Basophils  Relative 0.7 0.0 - 3.0 %   Neutro Abs 5.2 1.4 - 7.7 K/uL   Lymphs Abs 3.7 0.7 - 4.0 K/uL   Monocytes Absolute 1.0 0.1 - 1.0 K/uL   Eosinophils Absolute 0.5 0.0 - 0.7 K/uL   Basophils Absolute 0.1 0.0 - 0.1 K/uL  Hepatic function panel  Result Value Ref Range   Total Bilirubin 0.5 0.2 - 1.2 mg/dL   Bilirubin, Direct 0.1 0.0 - 0.3 mg/dL   Alkaline Phosphatase 47 39 - 117 U/L   AST 26 0 - 37 U/L   ALT 24 0 - 53 U/L   Total Protein 7.7 6.0 - 8.3 g/dL   Albumin 4.5 3.5 - 5.2 g/dL  TSH  Result Value Ref Range   TSH 1.16 0.35 - 4.50 uIU/mL  Hemoglobin A1c  Result Value Ref Range   Hgb A1c MFr Bld 5.8 4.6 - 6.5 %     Assessment and Plan:   Other fatigue - Plan: Basic metabolic panel, CBC with Differential/Platelet, Hepatic function panel, TSH  Hyperglycemia - Plan: Hemoglobin A1c  Costochondritis  Bilateral numbness and tingling of arms and legs  ED (erectile dysfunction) of non-organic origin  Obstructive sleep apnea  Labs are normal. Fatigue seems almost certainly from OSA and working very long hours.   Chest pain certainly MSK.  ED: seems psychological, not physiological. Will give low dose sildenafil to try for a while.  B numbness combo cubital tunnel, possible brachial plexus. Trial splint at night. Discussed EMG if worsens.  Follow-up: No Follow-up on file.  New Prescriptions   SILDENAFIL (REVATIO) 20 MG TABLET    Generic Revatio / Sildanefil 20 mg. 2 - 5 tabs 30 mins prior to intercourse. #20, 5 refills.   Orders Placed This Encounter  Procedures  . Basic metabolic panel  . CBC with Differential/Platelet  . Hepatic function panel  . TSH  . Hemoglobin A1c    Signed,  Quay Simkin T. Shantara Goosby, MD   Patient's Medications  New Prescriptions   SILDENAFIL (REVATIO) 20 MG TABLET    Generic Revatio / Sildanefil 20 mg. 2 - 5 tabs 30 mins prior to intercourse. #20, 5 refills.  Previous Medications   AMLODIPINE (NORVASC) 5 MG TABLET    TAKE ONE TABLET BY MOUTH EVERY  DAY   LISINOPRIL (PRINIVIL,ZESTRIL) 40 MG TABLET    Take 1 tablet (40 mg total) by mouth daily.  Modified Medications   No medications on file  Discontinued Medications   No medications on file

## 2015-03-31 NOTE — Progress Notes (Signed)
Pre visit review using our clinic review tool, if applicable. No additional management support is needed unless otherwise documented below in the visit note. 

## 2015-04-07 ENCOUNTER — Other Ambulatory Visit: Payer: Self-pay | Admitting: Family Medicine

## 2015-04-07 MED ORDER — AMLODIPINE BESYLATE 5 MG PO TABS: 5.0000 mg | ORAL_TABLET | Freq: Every day | ORAL | Status: DC

## 2015-04-25 ENCOUNTER — Encounter: Payer: Self-pay | Admitting: Family Medicine

## 2015-04-25 ENCOUNTER — Ambulatory Visit (INDEPENDENT_AMBULATORY_CARE_PROVIDER_SITE_OTHER): Payer: No Typology Code available for payment source | Admitting: Family Medicine

## 2015-04-25 VITALS — BP 124/70 | HR 110 | Temp 98.0°F | Ht 71.5 in | Wt 281.5 lb

## 2015-04-25 DIAGNOSIS — M5441 Lumbago with sciatica, right side: Secondary | ICD-10-CM

## 2015-04-25 MED ORDER — PREDNISONE 20 MG PO TABS: ORAL_TABLET | ORAL | Status: DC

## 2015-04-25 MED ORDER — TRAMADOL HCL 50 MG PO TABS: 50.0000 mg | ORAL_TABLET | Freq: Four times a day (QID) | ORAL | Status: DC | PRN

## 2015-04-25 MED ORDER — TIZANIDINE HCL 4 MG PO TABS: 4.0000 mg | ORAL_TABLET | Freq: Every evening | ORAL | Status: DC

## 2015-04-25 NOTE — Progress Notes (Signed)
Pre visit review using our clinic review tool, if applicable. No additional management support is needed unless otherwise documented below in the visit note. 

## 2015-04-25 NOTE — Progress Notes (Signed)
Dr. Karleen HampshireSpencer T. Lacy Taglieri, MD, CAQ Sports Medicine Primary Care and Sports Medicine [address] [place], [id number] Phone: [phone] Fax: [phone]  04/25/2015  Patient: Vincent Murray, MRN: [id number], DOB: [birth year], 29 y.o.  Primary Physician:  Hannah BeatSpencer Svara Twyman, MD  Chief Complaint: Back Pain  Subjective:   Vincent Murray is a 29 y.o. very pleasant male patient who presents with the following: Back Pain  Acute Low Back - worker's comp.  DOI: 04/14/2015  1 1/2 weeks ago, changing a trailer tire. Bent over and pulling and R laterally on back. Thought pulled muscle and it never got better. He has been doing basic over-the-counter remedies including Tylenol and NSAIDs and heat. He also has been to the chiropractor several times. This is his first evaluation at our office or by medical practitioner.  Has been to the chiropractor 3-4 times. Has been adjusted each time. Shooting pain down R leg - stops 1/2 way down HS. His ambulation is markedly altered compared to baseline.  The patient has had back pain before. The back pain is localized into the lumbar spine area with R radiculopathy.  No numbness or tingling. No bowel or bladder incontinence. No focal weakness. Prior interventions: none, no surgery Physical therapy: No Chiropractic manipulations: YES Acupuncture: No Osteopathic manipulation: No Heat or cold: Minimal effect  Past Medical History, Surgical History, Family History, Medications, Allergies have been reviewed and updated if relevant.  GEN: No fevers, chills. Nontoxic. Primarily MSK c/o today. MSK: Detailed in the HPI GI: tolerating PO intake without difficulty Neuro: As above  Otherwise the pertinent positives of the ROS are noted above.    Objective:   Blood pressure 124/70, pulse 110, temperature 98 F (36.7 C), temperature source Oral, height 5' 11.5" (1.816 m), weight 281 lb 8 oz (127.688 kg).  Gen: Well-developed,well-nourished,in no  acute distress; alert,appropriate and cooperative throughout examination HEENT: Normocephalic and atraumatic without obvious abnormalities.  Ears, externally no deformities Pulm: Breathing comfortably in no respiratory distress Range of motion at  the waist: Flexion, rotation and lateral bending: Forward flexion to approximately 50. Extension is limited. Lateral bending is easier but still limited compared to baseline and loss of approximately 25.  No echymosis or edema Rises to examination table with difficulty Gait: moderately antalgic  Inspection/Deformity: No abnormality Paraspinus T:  l2-s1 TTP  B Ankle Dorsiflexion (L5,4): 5/5 B Great Toe Dorsiflexion (L5,4): 5/5 Heel Walk (L5): WNL Toe Walk (S1): WNL Rise/Squat (L4): WNL, mild pain  SENSORY B Medial Foot (L4): WNL B Dorsum (L5): WNL B Lateral (S1): WNL Light Touch: WNL Pinprick: WNL  REFLEXES Knee (L4): 2+ Ankle (S1): 2+  B SLR, seated: pain without radiculopathy  B SLR, supine: pain without radiculopathy B FABER: neg B Reverse FABER: neg B Greater Troch: NT B Log Roll: neg B Stork: NT B Sciatic Notch: TTP  Radiology: No results found.  Assessment and Plan:   Right-sided low back pain with right-sided sciatica  Initially, the patient did likely have a muscle injury, but that has completely resolved fairly quickly.  Historically and clinically, the patient is having some radicular symptoms and pain in his low back, radiculopathy down the right side in his thigh. This would more favor nerve encroachment. The patient does not have any numbness or weakness on neurological examination.  Continue conservative management, high-dose prednisone taper, tizanidine at nighttime, tramadol as needed for pain.  I also placed him on a light duty work restriction where he can lift a  maximal of 15 pounds. Sit/stand as needed.  At follow-up we will assess his condition and ability to alter work limitation.  Follow-up: 2  1/2 - 3 weeks  New Prescriptions   PREDNISONE (DELTASONE) 20 MG TABLET    2 tabs po for 5 days, then 1 tab po for 5 days   TIZANIDINE (ZANAFLEX) 4 MG TABLET    Take 1 tablet (4 mg total) by mouth Nightly.   TRAMADOL (ULTRAM) 50 MG TABLET    Take 1 tablet (50 mg total) by mouth every 6 (six) hours as needed for moderate pain.   No orders of the defined types were placed in this encounter.    Signed,  Elpidio Galea. Zackari Ruane, MD   Patient's Medications  New Prescriptions   PREDNISONE (DELTASONE) 20 MG TABLET    2 tabs po for 5 days, then 1 tab po for 5 days   TIZANIDINE (ZANAFLEX) 4 MG TABLET    Take 1 tablet (4 mg total) by mouth Nightly.   TRAMADOL (ULTRAM) 50 MG TABLET    Take 1 tablet (50 mg total) by mouth every 6 (six) hours as needed for moderate pain.  Previous Medications   AMLODIPINE (NORVASC) 5 MG TABLET    Take 1 tablet (5 mg total) by mouth daily.   LISINOPRIL (PRINIVIL,ZESTRIL) 40 MG TABLET    Take 1 tablet (40 mg total) by mouth daily.   SILDENAFIL (REVATIO) 20 MG TABLET    Generic Revatio / Sildanefil 20 mg. 2 - 5 tabs 30 mins prior to intercourse. #20, 5 refills.  Modified Medications   No medications on file  Discontinued Medications   No medications on file

## 2015-04-26 ENCOUNTER — Telehealth: Payer: Self-pay | Admitting: *Deleted

## 2015-04-26 NOTE — Telephone Encounter (Addendum)
[facility].  Left message for her to return call.

## 2015-05-09 ENCOUNTER — Ambulatory Visit (INDEPENDENT_AMBULATORY_CARE_PROVIDER_SITE_OTHER): Payer: No Typology Code available for payment source | Admitting: Family Medicine

## 2015-05-09 ENCOUNTER — Encounter: Payer: Self-pay | Admitting: Family Medicine

## 2015-05-09 VITALS — BP 120/66 | HR 82 | Temp 98.7°F | Ht 71.5 in | Wt 281.5 lb

## 2015-05-09 DIAGNOSIS — M5441 Lumbago with sciatica, right side: Secondary | ICD-10-CM

## 2015-05-09 NOTE — Progress Notes (Signed)
Pre visit review using our clinic review tool, if applicable. No additional management support is needed unless otherwise documented below in the visit note. 

## 2015-05-09 NOTE — Progress Notes (Signed)
Dr. Frederico Hamman T. Burdette Gergely, MD, [facility] [place], [address] Phone: [phone] Fax: [phone]  05/09/2015  Patient: Vincent Murray, MRN: [id number], DOB: Jan 01, 1986, 29 y.o.  Primary Physician:  Owens Loffler, MD  Chief Complaint: Follow-up  Subjective:   Vincent Murray is a 29 y.o. very pleasant male patient who presents with the following: Back Pain  Now, all down in the R leg, posterior buttocks and down all the way through the calf.   At this point, the patient is turned in his Worker's Comp. Case to his employer.  They wanted him to see [facility], who is their initial provider, and he is seen by them twice. They basically continued the care that I initiated, agreed with his prednisone, Zanaflex, and tramadol.  The prednisone did make him more irritable.  At this point he really does not think that he is any better at all.  He has not made any progress, but he also has not gotten worse.  04/25/2015 Last OV with Owens Loffler, MD  Acute Low Back - worker's comp.  DOI: 04/14/2015  1 1/2 weeks ago, changing a trailer tire. Bent over and pulling and R laterally on back. Thought pulled muscle and it never got better. He has been doing basic over-the-counter remedies including Tylenol and NSAIDs and heat. He also has been to the chiropractor several times. This is his first evaluation at our office or by medical practitioner.  Has been to the chiropractor 3-4 times. Has been adjusted each time. Shooting pain down R leg - stops 1/2 way down HS. His ambulation is markedly altered compared to baseline.  The patient has had back pain before. The back pain is localized into the lumbar spine area with R radiculopathy.  No numbness or tingling. No bowel or bladder incontinence. No focal weakness. Prior interventions: none, no surgery Physical therapy: No Chiropractic manipulations: YES Acupuncture:  No Osteopathic manipulation: No Heat or cold: Minimal effect  Past Medical History, Surgical History, Family History, Medications, Allergies have been reviewed and updated if relevant.  GEN: No fevers, chills. Nontoxic. Primarily MSK c/o today. MSK: Detailed in the HPI GI: tolerating PO intake without difficulty Neuro: As above  Otherwise the pertinent positives of the ROS are noted above.    Objective:   Blood pressure 120/66, pulse 82, temperature 98.7 F (37.1 C), temperature source Oral, height 5' 11.5" (1.816 m), weight 281 lb 8 oz (127.688 kg).  Gen: Well-developed,well-nourished,in no acute distress; alert,appropriate and cooperative throughout examination HEENT: Normocephalic and atraumatic without obvious abnormalities.  Ears, externally no deformities Pulm: Breathing comfortably in no respiratory distress Range of motion at  the waist: Flexion, rotation and lateral bending: Forward flexion to approximately 50. Extension is limited. Lateral bending is easier but still limited compared to baseline and loss of approximately 25.  No echymosis or edema Rises to examination table with difficulty Gait: moderately antalgic  Inspection/Deformity: No abnormality Paraspinus T:  l2-s1 TTP  B Ankle Dorsiflexion (L5,4): 5/5 B Great Toe Dorsiflexion (L5,4): 5/5 Heel Walk (L5): WNL Toe Walk (S1): WNL Rise/Squat (L4): WNL, mild pain  SENSORY B Medial Foot (L4): WNL B Dorsum (L5): WNL B Lateral (S1): WNL Light Touch: WNL Pinprick: WNL  REFLEXES Knee (L4): 2+ Ankle (S1): 2+  B SLR, seated: pain without radiculopathy, R B SLR, supine: pain without radiculopathy, R B FABER: neg B Reverse FABER: neg B Greater Troch: NT B Log Roll:  neg B Stork: NT B Sciatic Notch: TTP  Radiology: No results found.  Assessment and Plan:   Right-sided low back pain with right-sided sciatica  His worker's comp provider would like for him to see one of the orthopedic surgeons that they  contract with, which is very reasonable and appropriate.  At this point, I told the patient that it is probably in his best interest to follow along with their management of his work-related injury.  Signed,  Maud Deed. Lamar Meter, MD   Patient's Medications  New Prescriptions   No medications on file  Previous Medications   AMLODIPINE (NORVASC) 5 MG TABLET    Take 1 tablet (5 mg total) by mouth daily.   LISINOPRIL (PRINIVIL,ZESTRIL) 40 MG TABLET    Take 1 tablet (40 mg total) by mouth daily.   SILDENAFIL (REVATIO) 20 MG TABLET    Generic Revatio / Sildanefil 20 mg. 2 - 5 tabs 30 mins prior to intercourse. #20, 5 refills.   TIZANIDINE (ZANAFLEX) 4 MG TABLET    Take 1 tablet (4 mg total) by mouth Nightly.   TRAMADOL (ULTRAM) 50 MG TABLET    Take 1 tablet (50 mg total) by mouth every 6 (six) hours as needed for moderate pain.  Modified Medications   No medications on file  Discontinued Medications   PREDNISONE (DELTASONE) 20 MG TABLET    2 tabs po for 5 days, then 1 tab po for 5 days

## 2015-05-12 ENCOUNTER — Other Ambulatory Visit: Payer: Self-pay | Admitting: Family Medicine

## 2015-06-25 ENCOUNTER — Other Ambulatory Visit: Payer: Self-pay | Admitting: Family Medicine

## 2015-06-27 NOTE — Telephone Encounter (Signed)
Last office visit 05/09/2015.  Last refilled 04/25/2015 for #30 with 2 refills.  Ok to refill?

## 2015-07-07 ENCOUNTER — Other Ambulatory Visit: Payer: Self-pay | Admitting: Family Medicine

## 2015-07-07 NOTE — Telephone Encounter (Signed)
Last office visit 05/09/2015.  Last refilled 04/25/2015 for #50 with 2 refills.  Ok to refill?

## 2015-07-07 NOTE — Telephone Encounter (Signed)
Called in to [facility].

## 2015-07-07 NOTE — Telephone Encounter (Signed)
Ok, 50, 3 ref 

## 2015-10-06 ENCOUNTER — Other Ambulatory Visit: Payer: Self-pay | Admitting: *Deleted

## 2015-10-06 MED ORDER — AMLODIPINE BESYLATE 5 MG PO TABS: 5.0000 mg | ORAL_TABLET | Freq: Every day | ORAL | Status: DC

## 2015-10-06 NOTE — Telephone Encounter (Signed)
Last office visit 05/09/2015.  Last CPE 03/17/2014.  Ok to refill?

## 2015-11-21 ENCOUNTER — Other Ambulatory Visit: Payer: Self-pay | Admitting: *Deleted

## 2015-11-21 MED ORDER — LISINOPRIL 40 MG PO TABS: 40.0000 mg | ORAL_TABLET | Freq: Every day | ORAL | Status: DC

## 2015-11-21 NOTE — Telephone Encounter (Signed)
Last office visit 05/09/2015.  Last OV that addressed HTN 05/26/2014.  Last CPE 03/17/2014.  Ok to refill?

## 2016-04-03 ENCOUNTER — Telehealth: Payer: Self-pay | Admitting: *Deleted

## 2016-04-03 MED ORDER — LISINOPRIL 40 MG PO TABS: 40.0000 mg | ORAL_TABLET | Freq: Every day | ORAL | Status: DC

## 2016-04-03 NOTE — Telephone Encounter (Signed)
Please call and schedule CPE with Dr. Copland. 

## 2016-04-05 NOTE — Telephone Encounter (Signed)
Left message asking pt to call office  °

## 2016-04-10 NOTE — Telephone Encounter (Signed)
Left message for Vincent Murray to call the office and schedule a CPE with Dr. Patsy Lageropland prior to any additional refills on his medications.

## 2016-04-17 NOTE — Telephone Encounter (Signed)
Spoke with pt .  Pt stated he didn't want to make an appointment.  He stated didn't want to come here any longer i took dr copland off as is pcp

## 2016-05-30 ENCOUNTER — Other Ambulatory Visit: Payer: Self-pay | Admitting: Family Medicine

## 2016-10-01 ENCOUNTER — Encounter: Payer: Self-pay | Admitting: Internal Medicine

## 2016-10-01 ENCOUNTER — Ambulatory Visit (INDEPENDENT_AMBULATORY_CARE_PROVIDER_SITE_OTHER): Payer: BLUE CROSS/BLUE SHIELD | Admitting: Internal Medicine

## 2016-10-01 VITALS — BP 152/84 | HR 80 | Temp 98.6°F | Wt 270.0 lb

## 2016-10-01 DIAGNOSIS — I10 Essential (primary) hypertension: Secondary | ICD-10-CM | POA: Diagnosis not present

## 2016-10-01 DIAGNOSIS — N521 Erectile dysfunction due to diseases classified elsewhere: Secondary | ICD-10-CM

## 2016-10-01 MED ORDER — AMLODIPINE BESYLATE 5 MG PO TABS: 5.0000 mg | ORAL_TABLET | Freq: Every day | ORAL | 0 refills | Status: DC

## 2016-10-01 MED ORDER — SILDENAFIL CITRATE 20 MG PO TABS: ORAL_TABLET | ORAL | 1 refills | Status: DC

## 2016-10-01 MED ORDER — LISINOPRIL 40 MG PO TABS: 40.0000 mg | ORAL_TABLET | Freq: Every day | ORAL | 0 refills | Status: DC

## 2016-10-01 NOTE — Progress Notes (Signed)
Subjective:    Patient ID: Vincent AbrahamJohnathan A Brownlee, male    DOB: [birth year], 30 y.o.   MRN: [id number]  HPI  Pt presents to the clinic today for follow up and med refill. He has not been seen by his PCP since 05/2015.  HTN: He reports he is prescribed Lisinopril and Norvasc, but he has been out for the last 2 days. His BP today was 152/84. He denies chest pain, chest tightness or shortness of breath. There is no ECG on file.  ED: He feels this is caused by stress. He is working with a psychiatrist who is planning on putting him on medication for anxiety, which he thinks will help solve his problems, but in the meantime, he would like a refill of Revatio.  Review of Systems      Past Medical History:  Diagnosis Date  . Hypertension   . Smoker     Current Outpatient Prescriptions  Medication Sig Dispense Refill  . amLODipine (NORVASC) 5 MG tablet TAKE ONE TABLET BY MOUTH ONCE DAILY 90 tablet 0  . lisinopril (PRINIVIL,ZESTRIL) 40 MG tablet TAKE ONE TABLET EVERY DAY 90 tablet 0  . sildenafil (REVATIO) 20 MG tablet Generic Revatio / Sildanefil 20 mg. 2 - 5 tabs 30 mins prior to intercourse. #20, 5 refills. 20 tablet 5  . tiZANidine (ZANAFLEX) 4 MG tablet TAKE ONE TABLET BY MOUTH AT BEDTIME 30 tablet 4  . traMADol (ULTRAM) 50 MG tablet TAKE ONE TABLET BY MOUTH EVERY 6 HOURS AS NEEDED FOR MODERATE PAIN 50 tablet 3   No current facility-administered medications for this visit.     Allergies  Allergen Reactions  . Penicillins Rash    REACTION: rash (ALL CILLINS)    Family History  Problem Relation Age of Onset  . Stroke Maternal Grandfather   . Heart disease Maternal Grandfather   . Hypertension Other     runs in family    Social History   Social History  . Marital status: Married    Spouse name: Aundra MilletMegan   . Number of children: N/A  . Years of education: N/A   Occupational History  .  [employer]   Social History Main Topics  . Smoking status: Current Some Day Smoker   Packs/day: 0.50    Types: Cigarettes  . Smokeless tobacco: Former NeurosurgeonUser  . Alcohol use 0.0 oz/week     Comment: rare  . Drug use: No  . Sexual activity: Not on file   Other Topics Concern  . Not on file   Social History Narrative  . No narrative on file     Constitutional: Denies fever, malaise, fatigue, headache or abrupt weight changes.  Respiratory: Denies difficulty breathing, shortness of breath, cough or sputum production.   Cardiovascular: Denies chest pain, chest tightness, palpitations or swelling in the hands or feet.  GU: Pt reports erectile dysfunction. Denies urgency, frequency, pain with urination, burning sensation, blood in urine, odor or discharge. Neurological: Denies dizziness, difficulty with memory, difficulty with speech or problems with balance and coordination.  Psych: Pt reports stress and anxiety. Denies depression, SI/HI.  No other specific complaints in a complete review of systems (except as listed in HPI above).  Objective:   Physical Exam    BP (!) 152/84   Pulse 80   Temp 98.6 F (37 C) (Oral)   Wt 270 lb (122.5 kg)   SpO2 97%   BMI 37.13 kg/m  Wt Readings from Last 3 Encounters:  10/01/16 270 lb (  122.5 kg)  05/09/15 281 lb 8 oz (127.7 kg)  04/25/15 281 lb 8 oz (127.7 kg)    General: Appears his stated age, obese in NAD. Cardiovascular: Normal rate and rhythm. S1,S2 noted.  No murmur, rubs or gallops noted.  Pulmonary/Chest: Normal effort and positive vesicular breath sounds. No respiratory distress. No wheezes, rales or ronchi noted.  Neurological: Alert and oriented.  Psychiatric: Mood and affect normal. Behavior is normal. Judgment and thought content normal.     BMET    Component Value Date/Time   NA 134 (L) 03/31/2015 1205   K 4.4 03/31/2015 1205   CL 101 03/31/2015 1205   CO2 28 03/31/2015 1205   GLUCOSE 87 03/31/2015 1205   BUN 16 03/31/2015 1205   CREATININE 1.17 03/31/2015 1205   CALCIUM 10.0 03/31/2015 1205     Lipid Panel     Component Value Date/Time   CHOL 148 03/17/2014 0855   TRIG 270.0 (H) 03/17/2014 0855   HDL 32.60 (L) 03/17/2014 0855   CHOLHDL 5 03/17/2014 0855   VLDL 54.0 (H) 03/17/2014 0855   LDLCALC 61 03/17/2014 0855    CBC    Component Value Date/Time   WBC 10.5 03/31/2015 1205   RBC 4.64 03/31/2015 1205   HGB 13.3 03/31/2015 1205   HCT 39.2 03/31/2015 1205   PLT 288.0 03/31/2015 1205   MCV 84.5 03/31/2015 1205   MCHC 34.0 03/31/2015 1205   RDW 13.7 03/31/2015 1205   LYMPHSABS 3.7 03/31/2015 1205   MONOABS 1.0 03/31/2015 1205   EOSABS 0.5 03/31/2015 1205   BASOSABS 0.1 03/31/2015 1205    Hgb A1C Lab Results  Component Value Date   HGBA1C 5.8 03/31/2015          Assessment & Plan:

## 2016-10-01 NOTE — Patient Instructions (Signed)
Hypertension Hypertension, commonly called high blood pressure, is when the force of blood pumping through your arteries is too strong. Your arteries are the blood vessels that carry blood from your heart throughout your body. A blood pressure reading consists of a higher number over a lower number, such as 110/72. The higher number (systolic) is the pressure inside your arteries when your heart pumps. The lower number (diastolic) is the pressure inside your arteries when your heart relaxes. Ideally you want your blood pressure below 120/80. Hypertension forces your heart to work harder to pump blood. Your arteries may become narrow or stiff. Having untreated or uncontrolled hypertension can cause heart attack, stroke, kidney disease, and other problems. RISK FACTORS Some risk factors for high blood pressure are controllable. Others are not.  Risk factors you cannot control include:   Race. You may be at higher risk if you are African American.  Age. Risk increases with age.  Gender. Men are at higher risk than women before age 45 years. After age 65, women are at higher risk than men. Risk factors you can control include:  Not getting enough exercise or physical activity.  Being overweight.  Getting too much fat, sugar, calories, or salt in your diet.  Drinking too much alcohol. SIGNS AND SYMPTOMS Hypertension does not usually cause signs or symptoms. Extremely high blood pressure (hypertensive crisis) may cause headache, anxiety, shortness of breath, and nosebleed. DIAGNOSIS To check if you have hypertension, your health care provider will measure your blood pressure while you are seated, with your arm held at the level of your heart. It should be measured at least twice using the same arm. Certain conditions can cause a difference in blood pressure between your right and left arms. A blood pressure reading that is higher than normal on one occasion does not mean that you need treatment. If  it is not clear whether you have high blood pressure, you may be asked to return on a different day to have your blood pressure checked again. Or, you may be asked to monitor your blood pressure at home for 1 or more weeks. TREATMENT Treating high blood pressure includes making lifestyle changes and possibly taking medicine. Living a healthy lifestyle can help lower high blood pressure. You may need to change some of your habits. Lifestyle changes may include:  Following the DASH diet. This diet is high in fruits, vegetables, and whole grains. It is low in salt, red meat, and added sugars.  Keep your sodium intake below 2,300 mg per day.  Getting at least 30-45 minutes of aerobic exercise at least 4 times per week.  Losing weight if necessary.  Not smoking.  Limiting alcoholic beverages.  Learning ways to reduce stress. Your health care provider may prescribe medicine if lifestyle changes are not enough to get your blood pressure under control, and if one of the following is true:  You are 18-59 years of age and your systolic blood pressure is above 140.  You are 60 years of age or older, and your systolic blood pressure is above 150.  Your diastolic blood pressure is above 90.  You have diabetes, and your systolic blood pressure is over 140 or your diastolic blood pressure is over 90.  You have kidney disease and your blood pressure is above 140/90.  You have heart disease and your blood pressure is above 140/90. Your personal target blood pressure may vary depending on your medical conditions, your age, and other factors. HOME CARE INSTRUCTIONS    Have your blood pressure rechecked as directed by your health care provider.   Take medicines only as directed by your health care provider. Follow the directions carefully. Blood pressure medicines must be taken as prescribed. The medicine does not work as well when you skip doses. Skipping doses also puts you at risk for  problems.  Do not smoke.   Monitor your blood pressure at home as directed by your health care provider. SEEK MEDICAL CARE IF:   You think you are having a reaction to medicines taken.  You have recurrent headaches or feel dizzy.  You have swelling in your ankles.  You have trouble with your vision. SEEK IMMEDIATE MEDICAL CARE IF:  You develop a severe headache or confusion.  You have unusual weakness, numbness, or feel faint.  You have severe chest or abdominal pain.  You vomit repeatedly.  You have trouble breathing. MAKE SURE YOU:   Understand these instructions.  Will watch your condition.  Will get help right away if you are not doing well or get worse.   This information is not intended to replace advice given to you by your health care provider. Make sure you discuss any questions you have with your health care provider.   Document Released: 12/17/2005 Document Revised: 05/03/2015 Document Reviewed: 10/09/2013 Elsevier Interactive Patient Education 2016 [employer].  

## 2016-10-02 DIAGNOSIS — N529 Male erectile dysfunction, unspecified: Secondary | ICD-10-CM | POA: Insufficient documentation

## 2016-10-02 NOTE — Assessment & Plan Note (Signed)
Uncontrolled off meds Will refill Lisinopril and Norvasc He will monitor BP at home and let us know if persist > 130/80

## 2016-10-02 NOTE — Assessment & Plan Note (Signed)
Stress induced Revatio refilled today

## 2016-12-31 DIAGNOSIS — F419 Anxiety disorder, unspecified: Secondary | ICD-10-CM

## 2016-12-31 HISTORY — DX: Anxiety disorder, unspecified: F41.9

## 2017-01-09 ENCOUNTER — Other Ambulatory Visit: Payer: Self-pay | Admitting: Family Medicine

## 2017-01-09 ENCOUNTER — Other Ambulatory Visit: Payer: Self-pay | Admitting: Internal Medicine

## 2017-05-01 ENCOUNTER — Other Ambulatory Visit: Payer: Self-pay | Admitting: Orthopaedic Surgery

## 2017-05-01 DIAGNOSIS — M5136 Other intervertebral disc degeneration, lumbar region: Secondary | ICD-10-CM

## 2017-05-06 ENCOUNTER — Ambulatory Visit (INDEPENDENT_AMBULATORY_CARE_PROVIDER_SITE_OTHER): Payer: BLUE CROSS/BLUE SHIELD | Admitting: Neurology

## 2017-05-06 ENCOUNTER — Encounter (INDEPENDENT_AMBULATORY_CARE_PROVIDER_SITE_OTHER): Payer: Self-pay

## 2017-05-06 ENCOUNTER — Encounter: Payer: Self-pay | Admitting: Neurology

## 2017-05-06 VITALS — BP 108/60 | HR 86 | Resp 18 | Ht 71.5 in | Wt 254.0 lb

## 2017-05-06 DIAGNOSIS — F17200 Nicotine dependence, unspecified, uncomplicated: Secondary | ICD-10-CM

## 2017-05-06 DIAGNOSIS — E669 Obesity, unspecified: Secondary | ICD-10-CM | POA: Diagnosis not present

## 2017-05-06 DIAGNOSIS — G4719 Other hypersomnia: Secondary | ICD-10-CM | POA: Diagnosis not present

## 2017-05-06 DIAGNOSIS — R0683 Snoring: Secondary | ICD-10-CM

## 2017-05-06 DIAGNOSIS — R0681 Apnea, not elsewhere classified: Secondary | ICD-10-CM

## 2017-05-06 DIAGNOSIS — I10 Essential (primary) hypertension: Secondary | ICD-10-CM

## 2017-05-06 DIAGNOSIS — F419 Anxiety disorder, unspecified: Secondary | ICD-10-CM

## 2017-05-06 NOTE — Patient Instructions (Signed)

## 2017-05-06 NOTE — Progress Notes (Signed)
Subjective:    Patient ID: Vincent Murray is a 31 y.o. male.  HPI     Huston Foley, MD, PhD [facility] [address], [address] [address] [place], [place] [address]  Dear [name],   I saw your patient, Vincent Murray, upon your kind request in my neurologic clinic today for initial consultation of his sleep disorder, in particular, concern for underlying obstructive sleep apnea. The patient is unaccompanied today. As you know, Mr. Vandrunen is a very pleasant 31 year old right-handed gentleman with an underlying medical history of hypertension, anxiety disorder, smoking, and obesity, who reports snoring and excessive daytime somnolence as well as witnessed apneas per wife's report. I reviewed your office note from 03/20/2017, which you kindly included. His Epworth sleepiness score is 11 out of 24, fatigue score is 27 out of 63. He is married and lives with his wife, they have no children. He smokes cigarettes, a few per day, he drinks alcohol very occasionally, drinks caffeine in the form of sweet tea, 2-3 cups per day on average. He tries to drink water and Gatorade otherwise. He does not recall any family history of obstructive sleep apnea but his mother snores heavily. He brings several video clips of his sleep that were documented by his wife on his cell phone, he was snoring loudly, had some pauses in his breathing and snorting sounds as well.  He does not always sleep in the same bedroom as his wife because of his loud disturbing snoring. Bedtime is between 9 and 10 PM, wakeup time around 5:50 AM. 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 He denies any night to night nocturia or morning headaches or restless leg symptoms. He is not aware of any leg movements while asleep or other parasomnias. Of note, he has been on high blood pressure medication for at least 5 years, was told that he had hypertension when he was a teenager even.  His Past Medical  History Is Significant For: Past Medical History:  Diagnosis Date  . Hypertension   . Smoker     His Past Surgical History Is Significant For: Past Surgical History:  Procedure Laterality Date  . WISDOM TOOTH EXTRACTION      His Family History Is Significant For: Family History  Problem Relation Age of Onset  . Stroke Maternal Grandfather   . Heart disease Maternal Grandfather   . Hypertension Other     runs in family    His Social History Is Significant For: Social History   Social History  . Marital status: Married    Spouse name: Vincent Murray   . Number of children: 0  . Years of education: HS   Occupational History  . [employer]    Social History Main Topics  . Smoking status: Current Some Day Smoker    Packs/day: 0.50    Types: Cigarettes  . Smokeless tobacco: Former Neurosurgeon  . Alcohol use 0.0 oz/week     Comment: rare  . Drug use: No  . Sexual activity: Not Asked   Other Topics Concern  . None   Social History Narrative   Occasional caffeine use     His Allergies Are:  Allergies  Allergen Reactions  . Gluten Meal   . Penicillins Rash    REACTION: rash (ALL CILLINS)  :   His Current Medications Are:  Outpatient Encounter Prescriptions as of 05/06/2017  Medication Sig  . buPROPion (WELLBUTRIN XL) 150 MG 24 hr tablet TAKE 1 TABLET (150  MG) BY MOUTH DAILY IN THE MORNING  . lisinopril (PRINIVIL,ZESTRIL) 40 MG tablet TAKE ONE TABLET EVERY DAY  . [DISCONTINUED] amLODipine (NORVASC) 5 MG tablet TAKE ONE TABLET BY MOUTH EVERY DAY  . [DISCONTINUED] sildenafil (REVATIO) 20 MG tablet Generic Revatio / Sildanefil 20 mg. 2 - 5 tabs 30 mins prior to intercourse. #20, 5 refills.  . [DISCONTINUED] tiZANidine (ZANAFLEX) 4 MG tablet TAKE ONE TABLET BY MOUTH AT BEDTIME  . [DISCONTINUED] traMADol (ULTRAM) 50 MG tablet TAKE ONE TABLET BY MOUTH EVERY 6 HOURS AS NEEDED FOR MODERATE PAIN   No facility-administered encounter medications on file as of 05/06/2017.   :  Review  of Systems:  Out of a complete 14 point review of systems, all are reviewed and negative with the exception of these symptoms as listed below: Review of Systems  Neurological:       Snores, witnessed apnea, wakes up feeling tired, daytime fatigue, takes naps.    Epworth Sleepiness Scale 0= would never doze 1= slight chance of dozing 2= moderate chance of dozing 3= high chance of dozing  Sitting and reading:3 Watching TV:3 Sitting inactive in a public place (ex. Theater or meeting):0 As a passenger in a car for an hour without a break:0 Lying down to rest in the afternoon:3 Sitting and talking to someone:0 Sitting quietly after lunch (no alcohol):2 In a car, while stopped in traffic:0 Total:11  Objective:  Neurologic Exam  Physical Exam Physical Examination:   Vitals:   05/06/17 1600  BP: 108/60  Pulse: 86  Resp: 18   General Examination: The patient is a very pleasant 31 y.o. male in no acute distress. He appears well-developed and well-nourished and well groomed.   HEENT: Normocephalic, atraumatic, pupils are equal, round and reactive to light and accommodation. Extraocular tracking is good without limitation to gaze excursion or nystagmus noted. Normal smooth pursuit is noted. Hearing is grossly intact. Face is symmetric with normal facial animation and normal facial sensation. Speech is clear with no dysarthria noted. There is no hypophonia. There is no lip, neck/head, jaw or voice tremor. Neck is supple with full range of passive and active motion. There are no carotid bruits on auscultation. Oropharynx exam reveals: mild mouth dryness, good dental hygiene and moderate airway crowding, due to larger uvula and tonsillar size of 2+ b/l. Mallampati is class I. Tongue protrudes centrally and palate elevates symmetrically. Neck size is 17.75 inches. He has a minimal overbite.    Chest: Clear to auscultation without wheezing, rhonchi or crackles noted.  Heart: S1+S2+0, regular  and normal without murmurs, rubs or gallops noted.   Abdomen: Soft, non-tender and non-distended with normal bowel sounds appreciated on auscultation.  Extremities: There is no pitting edema in the distal lower extremities bilaterally. Pedal pulses are intact.  Skin: Warm and dry without trophic changes noted. There are no varicose veins.  Musculoskeletal: exam reveals no obvious joint deformities, tenderness or joint swelling or erythema, but reports midline LBP.   Neurologically:  Mental status: The patient is awake, alert and oriented in all 4 spheres. His immediate and remote memory, attention, language skills and fund of knowledge are appropriate. There is no evidence of aphasia, agnosia, apraxia or anomia. Speech is clear with normal prosody and enunciation. Thought process is linear. Mood is normal and affect is normal.  Cranial nerves II - XII are as described above under HEENT exam. In addition: shoulder shrug is normal with equal shoulder height noted. Motor exam: Normal bulk, strength and tone  is noted. There is no drift, tremor or rebound. Romberg is negative. Reflexes are 2+ throughout. Fine motor skills and coordination: intact in the UEs and LEs grossly.  Cerebellar testing: No dysmetria or intention tremor on finger to nose testing. Heel to shin is unremarkable bilaterally. There is no truncal or gait ataxia.  Sensory exam: intact to light touch in the upper and lower extremities.  Gait, station and balance: He stands slightly slowly. No veering to one side is noted. No leaning to one side is noted. Posture is age-appropriate and stance is narrow based. Gait shows normal stride length and normal pace. No problems turning are noted. He turns en bloc. Tandem walk is unremarkable.   Assessment and plan:  In summary, Stefanos A Joesphine BareGreeson is a very pleasant 31 y.o.-year old male with an underlying medical history of hypertension, anxiety disorder, smoking, and obesity, whose history and  physical exam are in keeping with with obstructive sleep apnea (OSA). I had a long chat with the patient about my findings and the diagnosis of OSA, its prognosis and treatment options. We talked about medical treatments, surgical interventions and non-pharmacological approaches. I explained in particular the risks and ramifications of untreated moderate to severe OSA, especially with respect to developing cardiovascular disease down the Road, including congestive heart failure, difficult to treat hypertension, cardiac arrhythmias, or stroke. Even type 2 diabetes has, in part, been linked to untreated OSA. Symptoms of untreated OSA include daytime sleepiness, memory problems, mood irritability and mood disorder such as depression and anxiety, lack of energy, as well as recurrent headaches, especially morning headaches. We talked about smoking cessation and trying to maintain a healthy lifestyle in general, as well as the importance of weight control. I encouraged the patient to eat healthy, exercise daily and keep well hydrated, to keep a scheduled bedtime and wake time routine, to not skip any meals and eat healthy snacks in between meals. I advised the patient not to drive when feeling sleepy. I recommended the following at this time: sleep study with potential positive airway pressure titration. (We will score hypopneas at 3%).   I explained the sleep test procedure to the patient and also outlined possible surgical and non-surgical treatment options of OSA, including the use of a custom-made dental device (which would require a referral to a specialist dentist or oral surgeon), upper airway surgical options, such as pillar implants, radiofrequency surgery, tongue base surgery, and UPPP (which would involve a referral to an ENT surgeon). Rarely, jaw surgery such as mandibular advancement may be considered.  I also explained the CPAP treatment option to the patient, who indicated that he would be willing to  try CPAP if the need arises. I explained the importance of being compliant with PAP treatment, not only for insurance purposes but primarily to improve His symptoms, and for the patient's long term health benefit, including to reduce His cardiovascular risks. I answered all his questions today and the patient was in agreement. I would like to see him back after the sleep study is completed and encouraged him to call with any interim questions, concerns, problems or updates.   Thank you very much for allowing me to participate in the care of this nice patient. If I can be of any further assistance to you please do not hesitate to call me at [phone].  Sincerely,   Huston FoleySaima Anissia Wessells, MD, PhD

## 2017-05-09 ENCOUNTER — Ambulatory Visit
Admission: RE | Admit: 2017-05-09 | Discharge: 2017-05-09 | Disposition: A | Discharge status: Home / Self Care | Payer: BLUE CROSS/BLUE SHIELD | Source: Ambulatory Visit | Attending: Orthopaedic Surgery | Admitting: Orthopaedic Surgery

## 2017-05-09 DIAGNOSIS — M5136 Other intervertebral disc degeneration, lumbar region: Secondary | ICD-10-CM

## 2017-05-12 ENCOUNTER — Ambulatory Visit (INDEPENDENT_AMBULATORY_CARE_PROVIDER_SITE_OTHER): Payer: BLUE CROSS/BLUE SHIELD | Admitting: Neurology

## 2017-05-12 DIAGNOSIS — G4733 Obstructive sleep apnea (adult) (pediatric): Secondary | ICD-10-CM | POA: Diagnosis not present

## 2017-05-15 NOTE — Addendum Note (Signed)
Addended by: Huston FoleyATHAR, Arya Luttrull on: 05/15/2017 06:10 PM   Modules accepted: Orders

## 2017-05-15 NOTE — Progress Notes (Signed)
Patient referred by Dr. Duanne Guessewey, seen by me on 05/06/17, diagnostic PSG on 05/12/17.     Please call and notify the patient that the recent sleep study did confirm the diagnosis of moderate to severe obstructive sleep apnea and that I recommend treatment for this in the form of CPAP. This will require a repeat sleep study for proper titration and mask fitting. Please explain to patient and arrange for a CPAP titration study. I have placed an order in the chart. Thanks, and [facility].  Huston FoleySaima Ashkan Chamberland, MD, PhD [facility]

## 2017-05-15 NOTE — Procedures (Signed)
PATIENT'S NAME:  Vincent Murray, Vincent Murray DOB:      07/28/1986      MR#:    [id number]     DATE OF RECORDING: 05/12/2017 REFERRING M.D.:  Maryelizabeth Rowan, MD Study Performed:   Baseline Polysomnogram HISTORY: 31 year old man with a history of hypertension, anxiety disorder, smoking, and obesity, who reports snoring and excessive daytime somnolence as well as witnessed apneas per wife's report. The patient endorsed the Epworth Sleepiness Scale at 11 points. The patient's weight 254 pounds with a height of 71.5 (inches), resulting in a BMI of 35.5 kg/m2. The patient's neck circumference measured 17.8 inches.  CURRENT MEDICATIONS: Wellbutrin, Lisinopril, Carisoprodol   PROCEDURE:  This is a multichannel digital polysomnogram utilizing the Somnostar 11.2 system.  Electrodes and sensors were applied and monitored per AASM Specifications.   EEG, EOG, Chin and Limb EMG, were sampled at 200 Hz.  ECG, Snore and Nasal Pressure, Thermal Airflow, Respiratory Effort, CPAP Flow and Pressure, Oximetry was sampled at 50 Hz. Digital video and audio were recorded.      BASELINE STUDY  Lights Out was at 22:32 and Lights On at 05:00.  Total recording time (TRT) was 388 minutes, with a total sleep time (TST) of  314.5 minutes.   The patient's sleep latency was 53 minutes, which is delayed.  REM latency was 114 minutes, which is high normal.  The sleep efficiency was 81.1 %.     SLEEP ARCHITECTURE: WASO (Wake after sleep onset) was 20.5 minutes with minimal sleep fragmentation noted.  There were 26.5 minutes in Stage N1, 148 minutes Stage N2, 58.5 minutes Stage N3 and 81.5 minutes in Stage REM.  The percentage of Stage N1 was 8.4%, which is borderline increased, Stage N2 was 47.1%, which is normal, Stage N3 was 18.6%, which is normal and Stage R (REM sleep) was 25.9%, which is high normal.  The arousals were noted as: 5 were spontaneous, 0 were associated with PLMs, 30 were associated with respiratory events.    Audio and video  analysis did not show any abnormal or unusual movements, behaviors, phonations or vocalizations.  The patient took no bathroom breaks. Intermittent mild to moderate snoring was noted. The EKG was in keeping with normal sinus rhythm (NSR).  RESPIRATORY ANALYSIS:  There were a total of 94 respiratory events:  39 obstructive apneas, 0 central apneas and 0 mixed apneas with a total of 39 apneas and an apnea index (AI) of 7.4 /hour. There were 55 hypopneas with a hypopnea index of 10.5 /hour. The patient also had 0 respiratory event related arousals (RERAs).      The total APNEA/HYPOPNEA INDEX (AHI) was 17.9/hour and the total RESPIRATORY DISTURBANCE INDEX was 17.9 /hour.  62 events occurred in REM sleep and 62 events in NREM. The REM AHI was 45.6 /hour, versus a non-REM AHI of 8.2. The patient spent 92 minutes of total sleep time in the supine position and 223 minutes in non-supine.. The supine AHI was 45.7 versus a non-supine AHI of 6.5.  OXYGEN SATURATION & C02:  The Wake baseline 02 saturation was 95%, with the lowest being 82%. Time spent below 89% saturation equaled 20 minutes.  PERIODIC LIMB MOVEMENTS: The patient had a total of 0 Periodic Limb Movements.  The Periodic Limb Movement (PLM) index was 0 and the PLM Arousal index was 0/hour.  Post-study, the patient indicated that sleep was the same as usual.   IMPRESSION:  1. Obstructive Sleep Apnea (OSA)  RECOMMENDATIONS:  1. This study demonstrates moderate  to severe obstructive sleep apnea, with a total AHI of 17.9/hour, REM AHI of 45.6/hour, supine AHI of 45.7/hour and O2 nadir of 82%. Given the patient's medical history and sleep related complaints, a full-night CPAP titration study is recommended to optimize therapy. Other treatment options may include avoidance of supine sleep position along with weight loss, upper airway or jaw surgery in selected patients or the use of an oral appliance in certain patients. ENT evaluation and/or  consultation with a maxillofacial surgeon or dentist may be feasible in some instances.    2. Please note that untreated obstructive sleep apnea carries additional perioperative morbidity. Patients with significant obstructive sleep apnea should receive perioperative PAP therapy and the surgeons and particularly the anesthesiologist should be informed of the diagnosis and the severity of the sleep disordered breathing. 3. The patient should be cautioned not to drive, work at heights, or operate dangerous or heavy equipment when tired or sleepy. Review and reiteration of good sleep hygiene measures should be pursued with any patient. 4. The patient will be [facility] in [facility] for discussion of the test results and further management strategies. The referring provider will be notified of the test results.    I certify that I have reviewed the entire raw data recording prior to the issuance of this report in accordance with the Standards of Accreditation of the American Academy of Sleep Medicine (AASM)    Huston FoleySaima Sidi Dzikowski, MD, PhD Diplomat, American Board of Psychiatry and Neurology (Neurology and Sleep Medicine)

## 2017-05-16 ENCOUNTER — Telehealth: Payer: Self-pay

## 2017-05-16 NOTE — Telephone Encounter (Signed)
I spoke to patient and he is aware of results and recommendations. He is willing to proceed with titration study. I will sent report to PCP.

## 2017-05-16 NOTE — Telephone Encounter (Signed)
-----   Message from Huston FoleySaima Athar, MD sent at 05/15/2017  6:10 PM EDT ----- Patient referred by Dr. Duanne Guessewey, seen by me on 05/06/17, diagnostic PSG on 05/12/17.     Please call and notify the patient that the recent sleep study did confirm the diagnosis of moderate to severe obstructive sleep apnea and that I recommend treatment for this in the form of CPAP. This will require a repeat sleep study for proper titration and mask fitting. Please explain to patient and arrange for a CPAP titration study. I have placed an order in the chart. Thanks, and [facility].  Huston FoleySaima Athar, MD, PhD [facility]

## 2017-06-07 ENCOUNTER — Ambulatory Visit (INDEPENDENT_AMBULATORY_CARE_PROVIDER_SITE_OTHER): Payer: BLUE CROSS/BLUE SHIELD | Admitting: Neurology

## 2017-06-07 DIAGNOSIS — G4733 Obstructive sleep apnea (adult) (pediatric): Secondary | ICD-10-CM

## 2017-06-07 DIAGNOSIS — G4720 Circadian rhythm sleep disorder, unspecified type: Secondary | ICD-10-CM

## 2017-06-13 ENCOUNTER — Telehealth: Payer: Self-pay | Admitting: *Deleted

## 2017-06-13 NOTE — Telephone Encounter (Signed)
-----   Message from Huston FoleySaima Athar, MD sent at 06/13/2017  8:30 AM EDT ----- Patient referred by Dr. Duanne Guessewey, seen by me on 05/06/17, diagnostic PSG on 05/12/17, cpap study on 06/07/17.   Please call and inform patient that I have entered an order for treatment with positive airway pressure (PAP) treatment of obstructive sleep apnea (OSA). He did well during the latest sleep study with CPAP. We will, therefore, arrange for a machine for home use through a DME (durable medical equipment) company of His choice; and I will see the patient back in follow-up in about 10 weeks, can schedule with MM or CM if needed. Please also explain to the patient that I will be looking out for compliance data, which can be downloaded from the machine (stored on an SD card, that is inserted in the machine) or via remote access through a modem, that is built into the machine. At the time of the followup appointment we will discuss sleep study results and how it is going with PAP treatment at home. Please advise patient to bring His machine at the time of the first FU visit, even though this is cumbersome. Bringing the machine for every visit after that will likely not be needed, but often helps for the first visit to troubleshoot if needed. Please re-enforce the importance of compliance with treatment and the need for us to monitor compliance data - often an insurance requirement and actually good feedback for the patient as far as how they are doing.  Also remind patient, that any interim PAP machine or mask issues should be first addressed with the DME company, as they can often help better with technical and mask fit issues. Please ask if patient has a preference regarding DME company.  Please also make sure, the patient has a follow-up appointment with me of CM or MM in about 10 weeks from the setup date, thanks.  Once you have spoken to the patient - and faxed/routed report to PCP and referring MD (if other than PCP), you can close this  encounter, thanks,   Huston FoleySaima Athar, MD, PhD [facility] ([facility]

## 2017-06-13 NOTE — Telephone Encounter (Signed)
-----   Message from Saima Athar, MD sent at 06/13/2017  8:30 AM EDT ----- Patient referred by Dr. Dewey, seen by me on 05/06/17, diagnostic PSG on 05/12/17, cpap study on 06/07/17.   Please call and inform patient that I have entered an order for treatment with positive airway pressure (PAP) treatment of obstructive sleep apnea (OSA). He did well during the latest sleep study with CPAP. We will, therefore, arrange for a machine for home use through a DME (durable medical equipment) company of His choice; and I will see the patient back in follow-up in about 10 weeks, can schedule with MM or CM if needed. Please also explain to the patient that I will be looking out for compliance data, which can be downloaded from the machine (stored on an SD card, that is inserted in the machine) or via remote access through a modem, that is built into the machine. At the time of the followup appointment we will discuss sleep study results and how it is going with PAP treatment at home. Please advise patient to bring His machine at the time of the first FU visit, even though this is cumbersome. Bringing the machine for every visit after that will likely not be needed, but often helps for the first visit to troubleshoot if needed. Please re-enforce the importance of compliance with treatment and the need for us to monitor compliance data - often an insurance requirement and actually good feedback for the patient as far as how they are doing.  Also remind patient, that any interim PAP machine or mask issues should be first addressed with the DME company, as they can often help better with technical and mask fit issues. Please ask if patient has a preference regarding DME company.  Please also make sure, the patient has a follow-up appointment with me of CM or MM in about 10 weeks from the setup date, thanks.  Once you have spoken to the patient - and faxed/routed report to PCP and referring MD (if other than PCP), you can close this  encounter, thanks,   Saima Athar, MD, PhD [facility] ([facility]   

## 2017-06-13 NOTE — Procedures (Signed)
PATIENT'S NAME:  Vincent Murray, Vincent Murray DOB:      [birth year]      MR#:    [id number]     DATE OF RECORDING: 06/07/2017 REFERRING M.D.:  Hannah Beat, MD Study Performed:   CPAP  Titration HISTORY:  31 year old man with a history of hypertension, anxiety disorder, smoking, and obesity, who returns for a full night CPAP titration. His PSG from 5/19/208 demonstrated moderate to severe obstructive sleep apnea, with a total AHI of 17.9/hour, REM AHI of 45.6/hour, supine AHI of 45.7/hour and O2 nadir of 82%. The patient endorsed the Epworth Sleepiness Scale at 11 points. The patient's weight 254 pounds with a height of 71 (inches), resulting in a BMI of 35.5 kg/m2. The patient's neck circumference measured 17 inches.  CURRENT MEDICATIONS: Wellbutrin, Lisinopril  PROCEDURE:  This is a multichannel digital polysomnogram utilizing the SomnoStar 11.2 system.  Electrodes and sensors were applied and monitored per AASM Specifications.   EEG, EOG, Chin and Limb EMG, were sampled at 200 Hz.  ECG, Snore and Nasal Pressure, Thermal Airflow, Respiratory Effort, CPAP Flow and Pressure, Oximetry was sampled at 50 Hz. Digital video and audio were recorded.      The patient was fitted with a large Eson nasal mask. CPAP was initiated at 5 cmH20 with heated humidity per AASM standards and pressure was advanced to 9 cmH20 because of hypopneas, apneas and desaturations.  At a PAP pressure of 9 cmH20, there was a reduction of the AHI to 2.6/hour, with supine NREM sleep achieved and O2 nadir of 94%.    Lights Out was at 22:17 and Lights On at 05:02. Total recording time (TRT) was 405.5 minutes, with a total sleep time (TST) of 366.5 minutes. The patient's sleep latency was 23 minutes. REM latency was 130 minutes, which is mildly delayed.  The sleep efficiency was 90.4%.    SLEEP ARCHITECTURE: WASO (Wake after sleep onset)  was 16 minutes.  There were 10 minutes in Stage N1, 255.5 minutes Stage N2, 83 minutes Stage N3 and 18 minutes  in Stage REM.  The percentage of Stage N1 was 2.7%, Stage N2 was 69.7%, which is increased, Stage N3 was 22.6%, which is normal and Stage R (REM sleep) was 4.9%, which is markedly reduced.  The arousals were noted as: 27 were spontaneous, 0 were associated with PLMs, 14 were associated with respiratory events.  Audio and video analysis did not show any abnormal or unusual movements, behaviors, phonations or vocalizations.  The patient took no bathroom breaks. The EKG was in keeping with normal sinus rhythm (NSR).  RESPIRATORY ANALYSIS:  There was a total of 23 respiratory events: 1 obstructive apneas, 1 central apneas and 4 mixed apneas with a total of 6 apneas and an apnea index (AI) of 1. /hour. There were 17 hypopneas with a hypopnea index of 2.8/hour. The patient also had 0 respiratory event related arousals (RERAs).      The total APNEA/HYPOPNEA INDEX  (AHI) was 3.8 /hour and the total RESPIRATORY DISTURBANCE INDEX was 3.8 .hour  0 events occurred in REM sleep and 23 events in NREM. The REM AHI was 0 /hour versus a non-REM AHI of 4. /hour.  The patient spent 260 minutes of total sleep time in the supine position and 107 minutes in non-supine. The supine AHI was 5.1, versus a non-supine AHI of 0.6.  OXYGEN SATURATION & C02:  The baseline 02 saturation was 94%, with the lowest being 89%. Time spent below 89% saturation equaled 0 minutes.  PERIODIC LIMB MOVEMENTS: The patient had a total of 0 Periodic Limb Movements. The Periodic Limb Movement (PLM) index was 0 and the PLM Arousal index was 0 /hour.    Post-study, the patient indicated that sleep was better than usual.   DIAGNOSIS 1. Obstructive Sleep Apnea (OSA) 2. Dysfunctions associated with sleep stages or arousal from sleep    PLANS/RECOMMENDATIONS: 1. This study demonstrates near-resolution of the patient's obstructive sleep apnea with CPAP therapy. Due to absence of REM sleep on the final pressure of 9 cm, I would like to start the  patient on home CPAP treatment at a pressure of 10 cm via large nasal mask with heated humidity. The patient should be reminded to be fully compliant with PAP therapy to improve sleep related symptoms and decrease long term cardiovascular risks. The patient should be reminded, that it may take up to 3 months to get fully used to using PAP with all planned sleep. The earlier full compliance is achieved, the better long term compliance tends to be. Please note that untreated obstructive sleep apnea carries additional perioperative morbidity. Patients with significant obstructive sleep apnea should receive perioperative PAP therapy and the surgeons and particularly the anesthesiologist should be informed of the diagnosis and the severity of the sleep disordered breathing. 2. This study shows sleep fragmentation and abnormal sleep stage percentages; these are nonspecific findings and per se do not signify an intrinsic sleep disorder or a cause for the patient's sleep-related symptoms. Causes include (but are not limited to) the first night effect of the sleep study, circadian rhythm disturbances, medication effect or an underlying mood disorder or medical problem.  3. The patient should be cautioned not to drive, work at heights, or operate dangerous or heavy equipment when tired or sleepy. Review and reiteration of good sleep hygiene measures should be pursued with any patient. 4. The patient will be [facility] discussion of the test results and further management strategies. The referring provider will be notified of the test results.  I certify that I have reviewed the entire raw data recording prior to the issuance of this report in accordance with the Standards of Accreditation of the American Academy of Sleep Medicine (AASM)   Huston FoleySaima Farhad Burleson, MD, PhD Diplomat, American Board of Psychiatry and Neurology (Neurology and Sleep Medicine)

## 2017-06-13 NOTE — Addendum Note (Signed)
Addended by: Huston FoleyATHAR, Shamyah Stantz on: 06/13/2017 08:30 AM   Modules accepted: Orders

## 2017-06-13 NOTE — Progress Notes (Signed)
Patient referred by Dr. Duanne Guessewey, seen by me on 05/06/17, diagnostic PSG on 05/12/17, cpap study on 06/07/17.   Please call and inform patient that I have entered an order for treatment with positive airway pressure (PAP) treatment of obstructive sleep apnea (OSA). He did well during the latest sleep study with CPAP. We will, therefore, arrange for a machine for home use through a DME (durable medical equipment) company of His choice; and I will see the patient back in follow-up in about 10 weeks, can schedule with MM or CM if needed. Please also explain to the patient that I will be looking out for compliance data, which can be downloaded from the machine (stored on an SD card, that is inserted in the machine) or via remote access through a modem, that is built into the machine. At the time of the followup appointment we will discuss sleep study results and how it is going with PAP treatment at home. Please advise patient to bring His machine at the time of the first FU visit, even though this is cumbersome. Bringing the machine for every visit after that will likely not be needed, but often helps for the first visit to troubleshoot if needed. Please re-enforce the importance of compliance with treatment and the need for us to monitor compliance data - often an insurance requirement and actually good feedback for the patient as far as how they are doing.  Also remind patient, that any interim PAP machine or mask issues should be first addressed with the DME company, as they can often help better with technical and mask fit issues. Please ask if patient has a preference regarding DME company.  Please also make sure, the patient has a follow-up appointment with me of CM or MM in about 10 weeks from the setup date, thanks.  Once you have spoken to the patient - and faxed/routed report to PCP and referring MD (if other than PCP), you can close this encounter, thanks,   Huston FoleySaima Adelheid Hoggard, MD, PhD [facility]

## 2017-06-13 NOTE — Telephone Encounter (Signed)
I have spoken with Kvon this afternoon and reviewed SA instructions as outlined below.  10 wk. compliance visit appt. made./fim

## 2017-06-17 NOTE — Telephone Encounter (Signed)
Noted, referral sent to [facility] and cpap start letter mailed to pt.

## 2017-06-17 NOTE — Telephone Encounter (Signed)
Referral sent to [facility]. Cpap start letter mailed to pt.

## 2017-08-26 ENCOUNTER — Encounter: Payer: Self-pay | Admitting: Neurology

## 2017-08-26 ENCOUNTER — Ambulatory Visit (INDEPENDENT_AMBULATORY_CARE_PROVIDER_SITE_OTHER): Payer: BLUE CROSS/BLUE SHIELD | Admitting: Neurology

## 2017-08-26 VITALS — BP 132/70 | HR 98 | Ht 72.0 in | Wt 263.0 lb

## 2017-08-26 DIAGNOSIS — Z9989 Dependence on other enabling machines and devices: Secondary | ICD-10-CM

## 2017-08-26 DIAGNOSIS — R635 Abnormal weight gain: Secondary | ICD-10-CM

## 2017-08-26 DIAGNOSIS — G4733 Obstructive sleep apnea (adult) (pediatric): Secondary | ICD-10-CM

## 2017-08-26 NOTE — Patient Instructions (Addendum)
Please look into using a salt water nasal rinse system, such as the EchoStar and follow the directions and my instructions. It may help your nasal congestion and help you tolerate your CPAP.  Please continue using your CPAP regularly. While your insurance requires that you use CPAP at least 4 hours each night on 70% of the nights, I recommend, that you not skip any nights and use it throughout the night if you can. Getting used to CPAP and staying with the treatment long term does take time and patience and discipline. Untreated obstructive sleep apnea when it is moderate to severe can have an adverse impact on cardiovascular health and raise her risk for heart disease, arrhythmias, hypertension, congestive heart failure, stroke and diabetes. Untreated obstructive sleep apnea causes sleep disruption, nonrestorative sleep, and sleep deprivation. This can have an impact on your day to day functioning and cause daytime sleepiness and impairment of cognitive function, memory loss, mood disturbance, and problems focussing. Using CPAP regularly can improve these symptoms.  Keep up the good work! We will see you back in 6 months for sleep apnea check up, you can see one of our nurse practitioners as you are stable. I will see you after that.   Hope, you feel better with your back. Please work on your weight.

## 2017-08-26 NOTE — Progress Notes (Signed)
Subjective:    Patient ID: Vincent Murray is a 31 y.o. male.  HPI     Interim history:   Mr. Vincent Murray is a very pleasant 31 year old right-handed gentleman with an underlying medical history of hypertension, anxiety disorder, smoking, and obesity, who presents for follow-up consultation of his obstructive sleep apnea, after recent sleep study testing. The patient is unaccompanied today. I first met him on 05/06/2017 at the request of his primary care physician, at which time he reported snoring, witnessed apneas and daytime somnolence. I advised him to return for sleep study testing. He had a baseline sleep study, followed by a CPAP titration study. I went over his test results with him in detail today. His baseline sleep study from 05/12/2017 showed a sleep efficiency of 81.1%, sleep latency of 53 minutes, REM latency of 114 minutes. He had a normal percentage of stage II sleep, normal percentage of slow-wave sleep and high normal percentage of REM sleep. Total AHI was 17.9 per hour, REM AHI 45.6 per hour, supine AHI 45.7 per hour. Average oxygen saturation was 95%, nadir was 82%. He had no significant PLMS, EKG or EEG changes. Based on his test results and his sleep related complaints I invited him for a full night CPAP titration study. He had this on 06/07/2018. Sleep efficiency was 90.4%, sleep latency was 23 minutes, REM latency was 130 minutes. He had a normal percentage of slow-wave sleep and REM sleep was markedly reduced at 4.9%. He was fitted with a nasal mask. CPAP was titrated from 5 cm to 9 cm with AHI of 2.6 on the final pressure, O2 nadir of 94%, supine non-REM sleep achieved. I suggested a home CPAP pressure of 10 cm.  Today, 08/26/2017 (all dictated new, as well as above notes, some dictation done in note pad or Word, outside of chart, may appear as copied):   I reviewed his CPAP compliance data from 07/26/2017 through 08/24/2017 which is a total of 30 days, during which time he  used his CPAP every night with percent used days greater than 4 hours at 90%, indicating excellent compliance with an average usage of 6 hours and 20 minutes, residual AHI 1.3 per hour, leak on the higher side with the 95th percentile at 21.6 L/m on a pressure of 10 cm with EPR of 3. He reports improved daytime symptoms since starting CPAP therapy including less somnolence, better sleep consolidation. He quit smoking in May or June. He has gained a little weight, also flare up of LBP. Sees Dr. Patrice Paradise for this, had recent ESI, which was very painful this time around, but eventually kicked in, just not as good as in the past. He has intermittent nasal congestion, no telltale symptoms of allergies, uses Afrin sparingly. Has tried nasal rinse but not consistently.  Previously (copied from previous notes for reference):   05/06/2017: (He) reports snoring and excessive daytime somnolence as well as witnessed apneas per wife's report. I reviewed your office note from 03/20/2017, which you kindly included. His Epworth sleepiness score is 11 out of 24, fatigue score is 27 out of 63. He is married and lives with his wife, they have no children. He smokes cigarettes, a few per day, he drinks alcohol very occasionally, drinks caffeine in the form of sweet tea, 2-3 cups per day on average. He tries to drink water and Gatorade otherwise. He does not recall any family history of obstructive sleep apnea but his mother snores heavily. He brings several video clips of  his sleep that were documented by his wife on his cell phone, he was snoring loudly, had some pauses in his breathing and snorting sounds as well.  He does not always sleep in the same bedroom as his wife because of his loud disturbing snoring. Bedtime is between 9 and 10 PM, wakeup time around 5:50 AM. 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 He denies any night to night nocturia or morning headaches or restless leg symptoms. He is not aware of any  leg movements while asleep or other parasomnias. Of note, he has been on high blood pressure medication for at least 5 years, was told that he had hypertension when he was a teenager even.  His Past Medical History Is Significant For: Past Medical History:  Diagnosis Date  . Hypertension   . Smoker     His Past Surgical History Is Significant For: Past Surgical History:  Procedure Laterality Date  . WISDOM TOOTH EXTRACTION      His Family History Is Significant For: Family History  Problem Relation Age of Onset  . Stroke Maternal Grandfather   . Heart disease Maternal Grandfather   . Hypertension Other        runs in family    His Social History Is Significant For: Social History   Social History  . Marital status: Married    Spouse name: Jinny Blossom   . Number of children: 0  . Years of education: HS   Occupational History  . [employer]    Social History Main Topics  . Smoking status: Current Some Day Smoker    Packs/day: 0.50    Types: Cigarettes  . Smokeless tobacco: Former Systems developer  . Alcohol use 0.0 oz/week     Comment: rare  . Drug use: No  . Sexual activity: Not Asked   Other Topics Concern  . None   Social History Narrative   Occasional caffeine use     His Allergies Are:  Allergies  Allergen Reactions  . Gluten Meal   . Penicillins Rash    REACTION: rash (ALL CILLINS)  :  His Current Medications Are:  Outpatient Encounter Prescriptions as of 08/26/2017  Medication Sig  . buPROPion (WELLBUTRIN XL) 150 MG 24 hr tablet TAKE 1 TABLET (150 MG) BY MOUTH DAILY IN THE MORNING  . cyclobenzaprine (FLEXERIL) 10 MG tablet Take 10 mg by mouth 2 (two) times daily.  Marland Kitchen lisinopril (PRINIVIL,ZESTRIL) 40 MG tablet TAKE ONE TABLET EVERY DAY  . oxyCODONE-acetaminophen (PERCOCET/ROXICET) 5-325 MG tablet Take 1 tablet by mouth every 6 (six) hours as needed.   No facility-administered encounter medications on file as of 08/26/2017.   :  Review of Systems:  Out of a  complete 14 point review of systems, all are reviewed and negative with the exception of these symptoms as listed below: Review of Systems  Neurological:       Pt presents today to discuss his cpap compliance. Pt reports being less sleepy during the day after using his cpap.    Objective:  Neurological Exam  Physical Exam Physical Examination:   Vitals:   08/26/17 1603  BP: 132/70  Pulse: 98   General Examination: The patient is a very pleasant 31 y.o. male in no acute distress. He appears well-developed and well-nourished and well groomed.   HEENT: Normocephalic, atraumatic, pupils are equal, round and reactive to light and accommodation. Extraocular tracking is good without limitation to gaze excursion or nystagmus noted. Normal smooth pursuit is noted. Hearing  is grossly intact. Face is symmetric with normal facial animation and normal facial sensation. Speech is Murray with no dysarthria noted. There is no hypophonia. There is no lip, neck/head, jaw or voice tremor. Neck is supple with full range of passive and active motion. There are no carotid bruits on auscultation. Oropharynx exam reveals: mild mouth dryness, good dental hygiene and moderate airway crowding. Mallampati is class I. Tongue protrudes centrally and palate elevates symmetrically. No significant nasal congestion.  Chest: Murray to auscultation without wheezing, rhonchi or crackles noted.  Heart: S1+S2+0, regular and normal without murmurs, rubs or gallops noted.   Abdomen: Soft, non-tender and non-distended with normal bowel sounds appreciated on auscultation.  Extremities: There is no pitting edema in the distal lower extremities bilaterally.   Skin: Warm and dry without trophic changes noted. There are no varicose veins.  Musculoskeletal: exam reveals no obvious joint deformities, tenderness or joint swelling or erythema, but reports midline LBP.   Neurologically:  Mental status: The patient is awake, alert  and oriented in all 4 spheres. His immediate and remote memory, attention, language skills and fund of knowledge are appropriate. There is no evidence of aphasia, agnosia, apraxia or anomia. Speech is Murray with normal prosody and enunciation. Thought process is linear. Mood is normal and affect is normal.  Cranial nerves II - XII are as described above under HEENT exam. Motor exam: Normal bulk, strength and tone is noted. There is no drift, tremor or rebound. Romberg is negative. Reflexes are 2+ throughout. Fine motor skills and coordination: intact in the UEs and LEs grossly.  Cerebellar testing: No dysmetria or intention tremor. There is no truncal or gait ataxia.  Sensory exam: intact to light touch in the upper and lower extremities.  Gait, station and balance: He stands slightly slowly. No veering to one side is noted. No leaning to one side is noted. Posture is age-appropriate and stance is narrow based. Gait shows normal stride length and normal pace. No problems turning are noted. Slight limp today.  Assessment and plan:  In summary, Tomoya A Kelleher is a very pleasant 31 year old male with an underlying medical history of hypertension, anxiety disorder, recent smoking cessation, LBP, and obesity, who presents for follow-up consultation of his moderate to severe obstructive sleep apnea as determined by his baseline sleep study from May 2018. He did well with CPAP during his CPAP titration study in June 2018. I went over his test results with him in detail today. He has been compliant with CPAP therapy and commended for this. He has noted improvement in his sleep consolidation, sleep quality and daytime somnolence. For intermittent nasal congestion he is encouraged to try saline nasal rinse, such as Neti pot. Cautioned about the regular use of Afrin. Physical exam is stable with the exception of interim weight gain. He also had a flareup in the interim of his low back pain and had a recent  epidural steroid injection and has a follow-up with his spine specialist in the next couple of weeks. He is encouraged to continue with treatment and also work on weight loss. I suggested a six-month follow-up with one of our nurse practitioners, sooner as needed. I answered all his questions today and he was in agreement. I spent 25 minutes in total face-to-face time with the patient, more than 50% of which was spent in counseling and coordination of care, reviewing test results, reviewing medication and discussing or reviewing the diagnosis of OSA, its prognosis and treatment options. Pertinent  laboratory and imaging test results that were available during this visit with the patient were reviewed by me and considered in my medical decision making (see chart for details).

## 2018-02-26 ENCOUNTER — Ambulatory Visit: Payer: BLUE CROSS/BLUE SHIELD | Admitting: Adult Health

## 2018-07-16 ENCOUNTER — Encounter (HOSPITAL_COMMUNITY): Payer: Self-pay | Admitting: *Deleted

## 2018-07-16 ENCOUNTER — Emergency Department (HOSPITAL_COMMUNITY): Payer: Self-pay

## 2018-07-16 ENCOUNTER — Other Ambulatory Visit: Payer: Self-pay

## 2018-07-16 ENCOUNTER — Emergency Department (HOSPITAL_COMMUNITY)
Admission: EM | Admit: 2018-07-16 | Discharge: 2018-07-16 | Disposition: A | Discharge status: Home / Self Care | Payer: Self-pay | Attending: Emergency Medicine | Admitting: Emergency Medicine

## 2018-07-16 DIAGNOSIS — Y9389 Activity, other specified: Secondary | ICD-10-CM | POA: Insufficient documentation

## 2018-07-16 DIAGNOSIS — Z79899 Other long term (current) drug therapy: Secondary | ICD-10-CM | POA: Insufficient documentation

## 2018-07-16 DIAGNOSIS — Z87891 Personal history of nicotine dependence: Secondary | ICD-10-CM | POA: Insufficient documentation

## 2018-07-16 DIAGNOSIS — M25531 Pain in right wrist: Secondary | ICD-10-CM | POA: Insufficient documentation

## 2018-07-16 DIAGNOSIS — Z23 Encounter for immunization: Secondary | ICD-10-CM | POA: Insufficient documentation

## 2018-07-16 DIAGNOSIS — I10 Essential (primary) hypertension: Secondary | ICD-10-CM | POA: Insufficient documentation

## 2018-07-16 DIAGNOSIS — T148XXA Other injury of unspecified body region, initial encounter: Secondary | ICD-10-CM | POA: Insufficient documentation

## 2018-07-16 DIAGNOSIS — Y92410 Unspecified street and highway as the place of occurrence of the external cause: Secondary | ICD-10-CM | POA: Insufficient documentation

## 2018-07-16 DIAGNOSIS — Y999 Unspecified external cause status: Secondary | ICD-10-CM | POA: Insufficient documentation

## 2018-07-16 DIAGNOSIS — M25572 Pain in left ankle and joints of left foot: Secondary | ICD-10-CM | POA: Insufficient documentation

## 2018-07-16 HISTORY — DX: Anxiety disorder, unspecified: F41.9

## 2018-07-16 MED ORDER — ACETAMINOPHEN 500 MG PO TABS: 500.0000 mg | ORAL_TABLET | Freq: Four times a day (QID) | ORAL | 0 refills | Status: DC | PRN

## 2018-07-16 MED ORDER — IBUPROFEN 600 MG PO TABS: 600.0000 mg | ORAL_TABLET | Freq: Four times a day (QID) | ORAL | 0 refills | Status: DC | PRN

## 2018-07-16 MED ORDER — TETANUS-DIPHTH-ACELL PERTUSSIS 5-2.5-18.5 LF-MCG/0.5 IM SUSP
0.5000 mL | Freq: Once | INTRAMUSCULAR | Status: AC
  Administered 2018-07-16: 0.5 mL via INTRAMUSCULAR
  Filled 2018-07-16: qty 0.5

## 2018-07-16 MED ORDER — CEPHALEXIN 500 MG PO CAPS: 500.0000 mg | ORAL_CAPSULE | Freq: Four times a day (QID) | ORAL | 0 refills | Status: DC
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
## 2018-07-16 NOTE — ED Notes (Signed)
Patient states he will pick up his own wrist splint from [employer] at discharge. Discharge instructions reviewed with patient patient discharged to home.
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
## 2018-07-16 NOTE — Discharge Instructions (Signed)
Wash your wounds 1-2 times daily and apply antibiotic ointment.  Take Keflex until completed.  You can get braces for your ankle and wrist at the drugstore, [facility], or [facility].  If you develop any increasing pain, redness, swelling, drainage from your wound, please return the emergency department.  Please see your doctor if your symptoms are not improving over the next 1 to 2 weeks.
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
## 2019-12-21 ENCOUNTER — Encounter: Payer: Self-pay | Admitting: Neurology

## 2019-12-22 ENCOUNTER — Encounter: Payer: Self-pay | Admitting: Neurology

## 2019-12-22 ENCOUNTER — Other Ambulatory Visit: Payer: Self-pay

## 2019-12-22 ENCOUNTER — Ambulatory Visit (INDEPENDENT_AMBULATORY_CARE_PROVIDER_SITE_OTHER): Payer: BC Managed Care – PPO | Admitting: Neurology

## 2019-12-22 VITALS — BP 122/64 | HR 79 | Temp 97.6°F | Ht 73.0 in | Wt 275.0 lb

## 2019-12-22 DIAGNOSIS — G4733 Obstructive sleep apnea (adult) (pediatric): Secondary | ICD-10-CM | POA: Diagnosis not present

## 2019-12-22 DIAGNOSIS — Z9989 Dependence on other enabling machines and devices: Secondary | ICD-10-CM | POA: Diagnosis not present

## 2019-12-22 NOTE — Patient Instructions (Signed)
Please continue using your CPAP regularly. While your insurance requires that you use CPAP at least 4 hours each night on 70% of the nights, I recommend, that you not skip any nights and use it throughout the night if you can. Getting used to CPAP and staying with the treatment long term does take time and patience and discipline. Untreated obstructive sleep apnea when it is moderate to severe can have an adverse impact on cardiovascular health and raise her risk for heart disease, arrhythmias, hypertension, congestive heart failure, stroke and diabetes. Untreated obstructive sleep apnea causes sleep disruption, nonrestorative sleep, and sleep deprivation. This can have an impact on your day to day functioning and cause daytime sleepiness and impairment of cognitive function, memory loss, mood disturbance, and problems focussing. Using CPAP regularly can improve these symptoms.  We can see you in 1 year, you can see one of our nurse practitioners as you are stable. As discussed, I will renew your CPAP supplies for your current machine and also prescribe a travel machine for you.

## 2019-12-22 NOTE — Progress Notes (Signed)
Subjective:    Patient ID: Vincent Murray is a 33 y.o. male.  HPI     Interim history:   Vincent Murray is a very pleasant 33 year old right-handed gentleman with an underlying medical history of hypertension, anxiety disorder, smoking, and obesity, who presents for follow-up consultation of his obstructive sleep apnea, after recent sleep study testing. The patient is unaccompanied today.  He presents after a longer gap of over 2 years, had canceled an appointment on 02/26/2018 with Vaughan Browner, nurse practitioner.  I last saw him on 08/26/2017, at which time he was compliant with his CPAP and reported improved daytime somnolence, better sleep quality and sleep consolidation.  He had stopped smoking.  He had recently received an epidural steroid injection for low back pain under Dr. Patrice Paradise.   Today, 12/22/2019: I reviewed his CPAP compliance data from 11/22/2019 through 12/21/2019 which is a total of 30 days, during which time he used his machine every night with percent use days greater than 4 hours at 93%, indicating excellent compliance with an average usage of 7 hours and 6 minutes, residual AHI at goal at 1.9/h, leak acceptable with a 95th percentile at 14.8 L/min on a pressure of 10 cm with EPR of 3.  He reports he needs new supplies, has been using a nasal mask.  He reports having had a job change and interim insurance change.  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  He has been compliant with treatment and continues to benefit from it, he generally speaking does not skip a night.   Previously (copied from previous notes for reference):   I first met him on 05/06/2017 at the request of his primary care physician, at which time he reported snoring, witnessed apneas and daytime somnolence. I advised him to return for sleep study testing. He had a baseline sleep study, followed by a CPAP titration  study. I went over his test results with him in detail today. His baseline sleep study from 05/12/2017 showed a sleep efficiency of 81.1%, sleep latency of 53 minutes, REM latency of 114 minutes. He had a normal percentage of stage II sleep, normal percentage of slow-wave sleep and high normal percentage of REM sleep. Total AHI was 17.9 per hour, REM AHI 45.6 per hour, supine AHI 45.7 per hour. Average oxygen saturation was 95%, nadir was 82%. He had no significant PLMS, EKG or EEG changes. Based on his test results and his sleep related complaints I invited him for a full night CPAP titration study. He had this on 06/07/2018. Sleep efficiency was 90.4%, sleep latency was 23 minutes, REM latency was 130 minutes. He had a normal percentage of slow-wave sleep and REM sleep was markedly reduced at 4.9%. He was fitted with a nasal mask. CPAP was titrated from 5 cm to 9 cm with AHI of 2.6 on the final pressure, O2 nadir of 94%, supine non-REM sleep achieved. I suggested a home CPAP pressure of 10 cm.   I reviewed his CPAP compliance data from 07/26/2017 through 08/24/2017 which is a total of 30 days, during which time he used his CPAP every night with percent used days greater than 4 hours at 90%, indicating excellent compliance with an average usage of 6 hours and 20 minutes, residual AHI 1.3 per hour, leak on the higher side with the 95th percentile at 21.6 L/m on a pressure of 10  cm with EPR of 3.    05/06/2017: (He) reports snoring and excessive daytime somnolence as well as witnessed apneas per wife's report. I reviewed your office note from 03/20/2017, which you kindly included. His Epworth sleepiness score is 11 out of 24, fatigue score is 27 out of 63. He is married and lives with his wife, they have no children. He smokes cigarettes, a few per day, he drinks alcohol very occasionally, drinks caffeine in the form of sweet tea, 2-3 cups per day on average. He tries to drink water and Gatorade otherwise. He  does not recall any family history of obstructive sleep apnea but his mother snores heavily. He brings several video clips of his sleep that were documented by his wife on his cell phone, he was snoring loudly, had some pauses in his breathing and snorting sounds as well.  He does not always sleep in the same bedroom as his wife because of his loud disturbing snoring. Bedtime is between 9 and 10 PM, wakeup time around 5:50 AM. 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 He denies any night to night nocturia or morning headaches or restless leg symptoms. He is not aware of any leg movements while asleep or other parasomnias. Of note, he has been on high blood pressure medication for at least 5 years, was told that he had hypertension when he was a teenager even.   His Past Medical History Is Significant For: Past Medical History:  Diagnosis Date  . Anxiety 2018  . Hypertension   . Smoker     His Past Surgical History Is Significant For: Past Surgical History:  Procedure Laterality Date  . WISDOM TOOTH EXTRACTION      His Family History Is Significant For: Family History  Problem Relation Age of Onset  . Stroke Maternal Grandfather   . Heart disease Maternal Grandfather   . Hypertension Other        runs in family  . Hypertension Mother   . Hypertension Father     His Social History Is Significant For: Social History   Socioeconomic History  . Marital status: Married    Spouse name: Jinny Blossom   . Number of children: 0  . Years of education: HS  . Highest education level: Not on file  Occupational History  . Occupation: [employer]  Tobacco Use  . Smoking status: Former Smoker    Packs/day: 0.00    Types: Cigarettes    Quit date: 06/30/2017    Years since quitting: 2.4  . Smokeless tobacco: Former Network engineer and Sexual Activity  . Alcohol use: Yes    Alcohol/week: 0.0 standard drinks    Comment: rare  . Drug use: No  . Sexual activity: Yes  Other Topics  Concern  . Not on file  Social History Narrative   Occasional caffeine use    Social Determinants of Health   Financial Resource Strain:   . Difficulty of Paying Living Expenses: Not on file  Food Insecurity:   . Worried About Charity fundraiser in the Last Year: Not on file  . Ran Out of Food in the Last Year: Not on file  Transportation Needs:   . Lack of Transportation (Medical): Not on file  . Lack of Transportation (Non-Medical): Not on file  Physical Activity:   . Days of Exercise per Week: Not on file  . Minutes of Exercise per Session: Not on file  Stress:   . Feeling of Stress :  Not on file  Social Connections:   . Frequency of Communication with Friends and Family: Not on file  . Frequency of Social Gatherings with Friends and Family: Not on file  . Attends Religious Services: Not on file  . Active Member of Clubs or Organizations: Not on file  . Attends Archivist Meetings: Not on file  . Marital Status: Not on file    His Allergies Are:  Allergies  Allergen Reactions  . Gluten Meal   . Penicillins Rash    REACTION: rash (ALL CILLINS)  :   His Current Medications Are:  Outpatient Encounter Medications as of 12/22/2019  Medication Sig  . buPROPion (WELLBUTRIN XL) 150 MG 24 hr tablet TAKE 1 TABLET (150 MG) BY MOUTH DAILY IN THE MORNING  . lisinopril (PRINIVIL,ZESTRIL) 40 MG tablet TAKE ONE TABLET EVERY DAY  . [DISCONTINUED] acetaminophen (TYLENOL) 500 MG tablet Take 1 tablet (500 mg total) by mouth every 6 (six) hours as needed.  . [DISCONTINUED] cephALEXin (KEFLEX) 500 MG capsule Take 1 capsule (500 mg total) by mouth 4 (four) times daily.  . [DISCONTINUED] cyclobenzaprine (FLEXERIL) 10 MG tablet Take 10 mg by mouth 2 (two) times daily.  . [DISCONTINUED] ibuprofen (ADVIL,MOTRIN) 600 MG tablet Take 1 tablet (600 mg total) by mouth every 6 (six) hours as needed.  . [DISCONTINUED] oxyCODONE-acetaminophen (PERCOCET/ROXICET) 5-325 MG tablet Take 1 tablet  by mouth every 6 (six) hours as needed.   No facility-administered encounter medications on file as of 12/22/2019.  :  Review of Systems:  Out of a complete 14 point review of systems, all are reviewed and negative with the exception of these symptoms as listed below: Review of Systems  Neurological:       Reports his machine is doing well. He sts he needs new order for supplies. He would also like to know if a travel size machine is available.    Objective:  Neurological Exam  Physical Exam Physical Examination:   Vitals:   12/22/19 1028  BP: 122/64  Pulse: 79  Temp: 97.6 F (36.4 C)  SpO2: 96%    General Examination: The patient is a very pleasant 33 y.o. male in no acute distress. He appears well-developed and well-nourished and well groomed.   HEENT:Normocephalic, atraumatic, pupils are equal, round and reactive to light, extraocular tracking is good without limitation to gaze excursion or nystagmus noted. Normal smooth pursuit is noted. Hearing is grossly intact. Face is symmetric with normal facial animation and normal facial sensation. Speech is clear with no dysarthria noted. There is no hypophonia. There is no lip, neck/head, jaw or voice tremor. Neck is supple with full range of passive and active motion. There are no carotid bruits on auscultation. Oropharynx exam reveals: gooddental hygiene and moderateairway crowding.Mallampati is class I. Tongue protrudes centrally and palate elevates symmetrically.   Chest:Clear to auscultation without wheezing, rhonchi or crackles noted.  Heart:S1+S2+0, regular and normal without murmurs, rubs or gallops noted.   Abdomen:Soft, non-tender and non-distended with normal bowel sounds appreciated on auscultation.  Extremities:There is nopitting edema in the distal lower extremities bilaterally.   Skin: Warm and dry without trophic changes noted.   Musculoskeletal: exam reveals no obvious joint deformities, tenderness  or joint swelling or erythema, but reports midline LBP.   Neurologically:  Mental status: The patient is awake, alert and oriented in all 4 spheres. Hisimmediate and remote memory, attention, language skills and fund of knowledge are appropriate. There is no evidence of aphasia, agnosia, apraxia  or anomia. Speech is clear with normal prosody and enunciation. Thought process is linear. Mood is normaland affect is normal.  Cranial nerves II - XII are as described above under HEENT exam. Motor exam: Normal bulk, strength and tone is noted. There is no drift, tremor or rebound. Romberg is negative. Fine motor skills and coordination: intact in the UEs and LEs grossly.  Cerebellar testing: No dysmetria or intention tremor. There is no truncal or gait ataxia.  Sensory exam: intact to light touch in the upper and lower extremities.  Gait, station and balance: Hestands slightly slowly. No veering to one side is noted. No leaning to one side is noted. Posture is age-appropriate and stance is narrow based. Gait shows normalstride length and normalpace. No problems turning are noted. Tandem walk is unremarkable.  Assessment and plan:  In summary, Finbar A Deleonardis a very pleasant 33 year old male with an underlying medical history of hypertension, anxiety disorder, recent smoking cessation, LBP, and obesity, who presents for follow-up consultation of his moderate to severe obstructive sleep apnea as determined by his baseline sleep study from May 2018. He did well with CPAP during his CPAP titration study in June 2018. He has been compliant with CPAP therapy and is commended for this. He noted improvement in his sleep consolidation, sleep quality and daytime somnolence. For intermittent nasal congestion he has used afrin prn, not daily. He seems to have less congestion when he is out of town especially when he is closer to the [facility]. He is in need for new supplies, I will order new supplies through  his DME company, In addition, he will benefit from having a travel machine as he often sleeps in the truck.  I will prescribe a travel CPAP as well.  He is doing well and is advised to routinely follow-up with the nurse practitioner in 1 year.  I answered all his questions today and he was in agreement.

## 2020-12-21 ENCOUNTER — Ambulatory Visit: Payer: BC Managed Care – PPO | Admitting: Family Medicine

## 2020-12-28 ENCOUNTER — Encounter: Payer: Self-pay | Admitting: Family Medicine

## 2020-12-28 ENCOUNTER — Other Ambulatory Visit: Payer: Self-pay

## 2020-12-28 ENCOUNTER — Ambulatory Visit (INDEPENDENT_AMBULATORY_CARE_PROVIDER_SITE_OTHER): Payer: PRIVATE HEALTH INSURANCE | Admitting: Family Medicine

## 2020-12-28 VITALS — BP 119/71 | HR 81 | Ht 72.0 in | Wt 279.6 lb

## 2020-12-28 DIAGNOSIS — G4733 Obstructive sleep apnea (adult) (pediatric): Secondary | ICD-10-CM | POA: Diagnosis not present

## 2020-12-28 DIAGNOSIS — Z9989 Dependence on other enabling machines and devices: Secondary | ICD-10-CM | POA: Diagnosis not present

## 2020-12-28 NOTE — Progress Notes (Addendum)
PATIENT: Vincent Murray DOB: 07-Jul-1986  REASON FOR VISIT: follow up HISTORY FROM: patient  Chief Complaint  Patient presents with  . CPAP follow up    RM 4 alone  Pt states he is doing well with CPAP, sleeps 90% of the night     HISTORY OF PRESENT ILLNESS: Today 12/28/20 Vincent Murray is a 34 y.o. male here today for follow up for OSA on CPAP.  He continues to do well with CPAP therapy.  He uses his machine every night.  He denies any difficulty with therapy or obtaining supplies.  He is feeling well today and without concerns.  Compliance report dated 11/27/2020 through 12/26/2020 reveals that he used CPAP 30 of the past 30 days for compliance of 100%.  4-hour compliance at 100%.  Average usage was 7 hours and 31 minutes.  Residual AHI was 1.2 on a set pressure of 10 cm of water and EPR of 3.  There was no significant leak noted.   HISTORY: (copied from previous note)  Mr. Vincent Murray is a very pleasant 34 year old right-handed gentleman with an underlying medical history of hypertension, anxiety disorder, smoking, and obesity, who presents for follow-up consultation of his obstructive sleep apnea, after recent sleep study testing. The patient is unaccompanied today.  He presents after a longer gap of over 2 years, had canceled an appointment on 02/26/2018 with Everlene Other, nurse practitioner.  I last saw him on 08/26/2017, at which time he was compliant with his CPAP and reported improved daytime somnolence, better sleep quality and sleep consolidation.  He had stopped smoking.  He had recently received an epidural steroid injection for low back pain under Dr. Noel Gerold.   Today, 12/22/2019: I reviewed his CPAP compliance data from 11/22/2019 through 12/21/2019 which is a total of 30 days, during which time he used his machine every night with percent use days greater than 4 hours at 93%, indicating excellent compliance with an average usage of 7 hours and 6 minutes, residual AHI  at goal at 1.9/h, leak acceptable with a 95th percentile at 14.8 L/min on a pressure of 10 cm with EPR of 3.  He reports he needs new supplies, has been using a nasal mask.  He reports having had a job change and interim insurance change.  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  He has been compliant with treatment and continues to benefit from it, he generally speaking does not skip a night.    REVIEW OF SYSTEMS: Out of a complete 14 system review of symptoms, the patient complains only of the following symptoms, none and all other reviewed systems are negative.  ESS: 0  ALLERGIES: Allergies  Allergen Reactions  . Gluten Meal   . Penicillins Rash    REACTION: rash (ALL CILLINS)    HOME MEDICATIONS: Outpatient Medications Prior to Visit  Medication Sig Dispense Refill  . buPROPion (WELLBUTRIN XL) 150 MG 24 hr tablet TAKE 1 TABLET (150 MG) BY MOUTH DAILY IN THE MORNING  1  . lisinopril (PRINIVIL,ZESTRIL) 40 MG tablet TAKE ONE TABLET EVERY DAY 90 tablet 1   No facility-administered medications prior to visit.    PAST MEDICAL HISTORY: Past Medical History:  Diagnosis Date  . Anxiety 2018  . Hypertension   . Smoker     PAST SURGICAL HISTORY: Past Surgical History:  Procedure Laterality Date  . WISDOM  TOOTH EXTRACTION      FAMILY HISTORY: Family History  Problem Relation Age of Onset  . Stroke Maternal Grandfather   . Heart disease Maternal Grandfather   . Hypertension Other        runs in family  . Hypertension Mother   . Hypertension Father     SOCIAL HISTORY: Social History   Socioeconomic History  . Marital status: Married    Spouse name: Aundra Millet   . Number of children: 0  . Years of education: HS  . Highest education level: Not on file  Occupational History  . Occupation: [employer]  Tobacco Use  . Smoking status: Current Every Day Smoker    Packs/day:  0.00    Types: Cigarettes    Last attempt to quit: 06/30/2017    Years since quitting: 3.4  . Smokeless tobacco: Former Engineer, water and Sexual Activity  . Alcohol use: Yes    Alcohol/week: 0.0 standard drinks    Comment: rare  . Drug use: No  . Sexual activity: Yes  Other Topics Concern  . Not on file  Social History Narrative   Once a day caffeine use    R handed   Lives home with wife   Social Determinants of Health   Financial Resource Strain: Not on file  Food Insecurity: Not on file  Transportation Needs: Not on file  Physical Activity: Not on file  Stress: Not on file  Social Connections: Not on file  Intimate Partner Violence: Not on file     PHYSICAL EXAM  Vitals:   12/28/20 1005  BP: 119/71  Pulse: 81  Weight: 279 lb 9.6 oz (126.8 kg)  Height: 6' (1.829 m)   Body mass index is 37.92 kg/m.  Generalized: Well developed, in no acute distress  Cardiology: normal rate and rhythm, no murmur noted Respiratory: clear to auscultation bilaterally  Neurological examination  Mentation: Alert oriented to time, place, history taking. Follows all commands speech and language fluent Cranial nerve II-XII: Pupils were equal round reactive to light. Extraocular movements were full, visual field were full  Motor: The motor testing reveals 5 over 5 strength of all 4 extremities. Good symmetric motor tone is noted throughout.  Gait and station: Gait is normal.    DIAGNOSTIC DATA (LABS, IMAGING, TESTING) - I reviewed patient records, labs, notes, testing and imaging myself where available.  No flowsheet data found.   Lab Results  Component Value Date   WBC 10.5 03/31/2015   HGB 13.3 03/31/2015   HCT 39.2 03/31/2015   MCV 84.5 03/31/2015   PLT 288.0 03/31/2015      Component Value Date/Time   NA 134 (L) 03/31/2015 1205   K 4.4 03/31/2015 1205   CL 101 03/31/2015 1205   CO2 28 03/31/2015 1205   GLUCOSE 87 03/31/2015 1205   BUN 16 03/31/2015 1205   CREATININE  1.17 03/31/2015 1205   CALCIUM 10.0 03/31/2015 1205   PROT 7.7 03/31/2015 1205   ALBUMIN 4.5 03/31/2015 1205   AST 26 03/31/2015 1205   ALT 24 03/31/2015 1205   ALKPHOS 47 03/31/2015 1205   BILITOT 0.5 03/31/2015 1205   Lab Results  Component Value Date   CHOL 148 03/17/2014   HDL 32.60 (L) 03/17/2014   LDLCALC 61 03/17/2014   TRIG 270.0 (H) 03/17/2014   CHOLHDL 5 03/17/2014   Lab Results  Component Value Date   HGBA1C 5.8 03/31/2015   No results found for: [id number] Lab Results  Component Value  Date   TSH 1.16 03/31/2015     ASSESSMENT AND PLAN 34 y.o. year old male  has a past medical history of Anxiety (2018), Hypertension, and Smoker. here with     ICD-10-CM   1. OSA on CPAP  G47.33 For home use only DME continuous positive airway pressure (CPAP)   Z99.89      Park A Delarocha is doing well on CPAP therapy. Compliance report reveals excellent complaince. He was encouraged to continue using CPAP nightly and for greater than 4 hours each night. We will update supply orders as indicated. Risks of untreated sleep apnea review and education materials provided. Healthy lifestyle habits encouraged. He will follow up in 1 year, sooner if needed. He verbalizes understanding and agreement with this plan.     Orders Placed This Encounter  Procedures  . For home use only DME continuous positive airway pressure (CPAP)    Supplies    Order Specific Question:   Length of Need    Answer:   Lifetime    Order Specific Question:   Patient has OSA or probable OSA    Answer:   Yes    Order Specific Question:   Is the patient currently using CPAP in the home    Answer:   Yes    Order Specific Question:   Settings    Answer:   Other see comments    Order Specific Question:   CPAP supplies needed    Answer:   Mask, headgear, cushions, filters, heated tubing and water chamber     No orders of the defined types were placed in this encounter.     I spent 15 minutes with the  patient. 50% of this time was spent counseling and educating patient on plan of care and medications.    Shawnie Dapper, FNP-C 12/28/2020, 10:12 AM [facility] [address], [address] [place], [place] [address] [phone]  I reviewed the above note and documentation by the Nurse Practitioner and agree with the history, exam, assessment and plan as outlined above. I was available for consultation. Huston Foley, MD, PhD [facility])

## 2020-12-28 NOTE — Progress Notes (Signed)
Community message was sent to [place] in regards to new CPAP order.

## 2020-12-28 NOTE — Patient Instructions (Signed)
Please continue using your CPAP regularly. While your insurance requires that you use CPAP at least 4 hours each night on 70% of the nights, I recommend, that you not skip any nights and use it throughout the night if you can. Getting used to CPAP and staying with the treatment long term does take time and patience and discipline. Untreated obstructive sleep apnea when it is moderate to severe can have an adverse impact on cardiovascular health and raise her risk for heart disease, arrhythmias, hypertension, congestive heart failure, stroke and diabetes. Untreated obstructive sleep apnea causes sleep disruption, nonrestorative sleep, and sleep deprivation. This can have an impact on your day to day functioning and cause daytime sleepiness and impairment of cognitive function, memory loss, mood disturbance, and problems focussing. Using CPAP regularly can improve these symptoms.   Follow up in 1 year   Sleep Apnea Sleep apnea affects breathing during sleep. It causes breathing to stop for a short time or to become shallow. It can also increase the risk of:  Heart attack.  Stroke.  Being very overweight (obese).  Diabetes.  Heart failure.  Irregular heartbeat. The goal of treatment is to help you breathe normally again. What are the causes? There are three kinds of sleep apnea:  Obstructive sleep apnea. This is caused by a blocked or collapsed airway.  Central sleep apnea. This happens when the brain does not send the right signals to the muscles that control breathing.  Mixed sleep apnea. This is a combination of obstructive and central sleep apnea. The most common cause of this condition is a collapsed or blocked airway. This can happen if:  Your throat muscles are too relaxed.  Your tongue and tonsils are too large.  You are overweight.  Your airway is too small. What increases the risk?  Being overweight.  Smoking.  Having a small airway.  Being older.  Being  male.  Drinking alcohol.  Taking medicines to calm yourself (sedatives or tranquilizers).  Having family members with the condition. What are the signs or symptoms?  Trouble staying asleep.  Being sleepy or tired during the day.  Getting angry a lot.  Loud snoring.  Headaches in the morning.  Not being able to focus your mind (concentrate).  Forgetting things.  Less interest in sex.  Mood swings.  Personality changes.  Feelings of sadness (depression).  Waking up a lot during the night to pee (urinate).  Dry mouth.  Sore throat. How is this diagnosed?  Your medical history.  A physical exam.  A test that is done when you are sleeping (sleep study). The test is most often done in a sleep lab but may also be done at home. How is this treated?   Sleeping on your side.  Using a medicine to get rid of mucus in your nose (decongestant).  Avoiding the use of alcohol, medicines to help you relax, or certain pain medicines (narcotics).  Losing weight, if needed.  Changing your diet.  Not smoking.  Using a machine to open your airway while you sleep, such as: ? An oral appliance. This is a mouthpiece that shifts your lower jaw forward. ? A CPAP device. This device blows air through a mask when you breathe out (exhale). ? An EPAP device. This has valves that you put in each nostril. ? A BPAP device. This device blows air through a mask when you breathe in (inhale) and breathe out.  Having surgery if other treatments do not work. It is   important to get treatment for sleep apnea. Without treatment, it can lead to:  High blood pressure.  Coronary artery disease.  In men, not being able to have an erection (impotence).  Reduced thinking ability. Follow these instructions at home: Lifestyle  Make changes that your doctor recommends.  Eat a healthy diet.  Lose weight if needed.  Avoid alcohol, medicines to help you relax, and some pain  medicines.  Do not use any products that contain nicotine or tobacco, such as cigarettes, e-cigarettes, and chewing tobacco. If you need help quitting, ask your doctor. General instructions  Take over-the-counter and prescription medicines only as told by your doctor.  If you were given a machine to use while you sleep, use it only as told by your doctor.  If you are having surgery, make sure to tell your doctor you have sleep apnea. You may need to bring your device with you.  Keep all follow-up visits as told by your doctor. This is important. Contact a doctor if:  The machine that you were given to use during sleep bothers you or does not seem to be working.  You do not get better.  You get worse. Get help right away if:  Your chest hurts.  You have trouble breathing in enough air.  You have an uncomfortable feeling in your back, arms, or stomach.  You have trouble talking.  One side of your body feels weak.  A part of your face is hanging down. These symptoms may be an emergency. Do not wait to see if the symptoms will go away. Get medical help right away. Call your local emergency services (911 in the [place] Do not drive yourself to the hospital. Summary  This condition affects breathing during sleep.  The most common cause is a collapsed or blocked airway.  The goal of treatment is to help you breathe normally while you sleep. This information is not intended to replace advice given to you by your health care provider. Make sure you discuss any questions you have with your health care provider. Document Revised: 10/03/2018 Document Reviewed: 08/12/2018 Elsevier Patient Education  2020 [employer]  

## 2021-08-01 ENCOUNTER — Telehealth: Payer: Self-pay | Admitting: Family Medicine

## 2021-08-01 ENCOUNTER — Other Ambulatory Visit: Payer: Self-pay | Admitting: Neurology

## 2021-08-01 DIAGNOSIS — G4733 Obstructive sleep apnea (adult) (pediatric): Secondary | ICD-10-CM

## 2021-08-01 NOTE — Telephone Encounter (Signed)
Called the patient back to clarify that he was wanting travel cpap machine ordered. Advised the pt also that this is a out of pocket expense and he is aware. Order will be [facility] to [facility] for [facility].pt was appreciative for the call back.

## 2021-08-01 NOTE — Telephone Encounter (Signed)
Pt called stating a prescription for a portable CPAP machine needs to be sent to [facility]. Please advise.

## 2022-01-02 NOTE — Patient Instructions (Addendum)
Please continue using your CPAP regularly. While your insurance requires that you use CPAP at least 4 hours each night on 70% of the nights, I recommend, that you not skip any nights and use it throughout the night if you can. Getting used to CPAP and staying with the treatment long term does take time and patience and discipline. Untreated obstructive sleep apnea when it is moderate to severe can have an adverse impact on cardiovascular health and raise her risk for heart disease, arrhythmias, hypertension, congestive heart failure, stroke and diabetes. Untreated obstructive sleep apnea causes sleep disruption, nonrestorative sleep, and sleep deprivation. This can have an impact on your day to day functioning and cause daytime sleepiness and impairment of cognitive function, memory loss, mood disturbance, and problems focussing. Using CPAP regularly can improve these symptoms.   I have sent orders for supplies and a new CPAP to [facility]. Please call with any concerns. Call me in July of 2023 and we will get you set up with a new machine.

## 2022-01-02 NOTE — Progress Notes (Signed)
PATIENT: Vincent Murray DOB: 1986-02-24  REASON FOR VISIT: follow up HISTORY FROM: patient  Chief Complaint  Patient presents with   Obstructive Sleep Apnea    Rm 1, alone. Here for yearly CPAP f/u. Pt reports doing well. No issues or concerns today. Pt would like to get a travel CPAP, regardless of cost.      HISTORY OF PRESENT ILLNESS: 01/03/22 ALL: Vincent Murray returns for follow up for OSA on CPAP. He continues to do well. He is using CPAp nightly. He can not sleep without it. He usually uses a full face mask but wishes to try nasal pillow as he has more congestion and feels the pressure of the mask around the nasal bridge contributes. He would also like a travel machine as he goes camping often.     08/01/2021 ALL:  Vincent Murray is a 36 y.o. male here today for follow up for OSA on CPAP.  He continues to do well with CPAP therapy.  He uses his machine every night.  He denies any difficulty with therapy or obtaining supplies.  He is feeling well today and without concerns.  Compliance report dated 11/27/2020 through 12/26/2020 reveals that he used CPAP 30 of the past 30 days for compliance of 100%.  4-hour compliance at 100%.  Average usage was 7 hours and 31 minutes.  Residual AHI was 1.2 on a set pressure of 10 cm of water and EPR of 3.  There was no significant leak noted.  HISTORY: (copied from previous note)  Vincent Murray is a very pleasant 36 year old right-handed gentleman with an underlying medical history of hypertension, anxiety disorder, smoking, and obesity, who presents for follow-up consultation of his obstructive sleep apnea, after recent sleep study testing. The patient is unaccompanied today.  He presents after a longer gap of over 2 years, had canceled an appointment on 02/26/2018 with Everlene OtherMegan Milliken, nurse practitioner.  I last saw him on 08/26/2017, at which time he was compliant with his CPAP and reported improved daytime somnolence, better sleep quality and  sleep consolidation.  He had stopped smoking.  He had recently received an epidural steroid injection for low back pain under Dr. Noel Geroldohen.    Today, 12/22/2019: I reviewed his CPAP compliance data from 11/22/2019 through 12/21/2019 which is a total of 30 days, during which time he used his machine every night with percent use days greater than 4 hours at 93%, indicating excellent compliance with an average usage of 7 hours and 6 minutes, residual AHI at goal at 1.9/h, leak acceptable with a 95th percentile at 14.8 L/min on a pressure of 10 cm with EPR of 3.  He reports he needs new supplies, has been using a nasal mask.  He reports having had a job change and interim insurance change.  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  He has been compliant with treatment and continues to benefit from it, he generally speaking does not skip a night.   REVIEW OF SYSTEMS: Out of a complete 14 system review of symptoms, the patient complains only of the following symptoms, none and all other reviewed systems are negative.  ESS: 2  ALLERGIES: Allergies  Allergen Reactions   Gluten Meal    Penicillins Rash    REACTION: rash (ALL CILLINS)    HOME MEDICATIONS: Outpatient Medications Prior to Visit  Medication Sig Dispense Refill  buPROPion (WELLBUTRIN XL) 150 MG 24 hr tablet TAKE 1 TABLET (150 MG) BY MOUTH DAILY IN THE MORNING  1   lisinopril (PRINIVIL,ZESTRIL) 40 MG tablet TAKE ONE TABLET EVERY DAY 90 tablet 1   No facility-administered medications prior to visit.    PAST MEDICAL HISTORY: Past Medical History:  Diagnosis Date   Anxiety 2018   Hypertension    Smoker     PAST SURGICAL HISTORY: Past Surgical History:  Procedure Laterality Date   WISDOM TOOTH EXTRACTION      FAMILY HISTORY: Family History  Problem Relation Age of Onset   Stroke Maternal Grandfather    Heart disease Maternal  Grandfather    Hypertension Other        runs in family   Hypertension Mother    Hypertension Father     SOCIAL HISTORY: Social History   Socioeconomic History   Marital status: Married    Spouse name: Megan    Number of children: 0   Years of education: HS   Highest education level: Not on file  Occupational History   Occupation: [employer]  Tobacco Use   Smoking status: Every Day    Packs/day: 0.00    Types: Cigarettes    Last attempt to quit: 06/30/2017    Years since quitting: 4.5   Smokeless tobacco: Former  Substance and Sexual Activity   Alcohol use: Yes    Alcohol/week: 0.0 standard drinks    Comment: rare   Drug use: No   Sexual activity: Yes  Other Topics Concern   Not on file  Social History Narrative   Once a day caffeine use    R handed   Lives home with wife   Social Determinants of Health   Financial Resource Strain: Not on file  Food Insecurity: Not on file  Transportation Needs: Not on file  Physical Activity: Not on file  Stress: Not on file  Social Connections: Not on file  Intimate Partner Violence: Not on file     PHYSICAL EXAM  Vitals:   01/03/22 0727  BP: 113/68  Pulse: 72  Weight: 280 lb (127 kg)  Height: 6' (1.829 m)    Body mass index is 37.97 kg/m.  Generalized: Well developed, in no acute distress  Cardiology: normal rate and rhythm, no murmur noted Respiratory: clear to auscultation bilaterally  Neurological examination  Mentation: Alert oriented to time, place, history taking. Follows all commands speech and language fluent Cranial nerve II-XII: Pupils were equal round reactive to light. Extraocular movements were full, visual field were full  Motor: The motor testing reveals 5 over 5 strength of all 4 extremities. Good symmetric motor tone is noted throughout.  Gait and station: Gait is normal.    DIAGNOSTIC DATA (LABS, IMAGING, TESTING) - I reviewed patient records, labs, notes, testing and imaging myself  where available.  No flowsheet data found.   Lab Results  Component Value Date   WBC 10.5 03/31/2015   HGB 13.3 03/31/2015   HCT 39.2 03/31/2015   MCV 84.5 03/31/2015   PLT 288.0 03/31/2015      Component Value Date/Time   NA 134 (L) 03/31/2015 1205   K 4.4 03/31/2015 1205   CL 101 03/31/2015 1205   CO2 28 03/31/2015 1205   GLUCOSE 87 03/31/2015 1205   BUN 16 03/31/2015 1205   CREATININE 1.17 03/31/2015 1205   CALCIUM 10.0 03/31/2015 1205   PROT 7.7 03/31/2015 1205   ALBUMIN 4.5 03/31/2015 1205   AST 26  03/31/2015 1205   ALT 24 03/31/2015 1205   ALKPHOS 47 03/31/2015 1205   BILITOT 0.5 03/31/2015 1205   Lab Results  Component Value Date   CHOL 148 03/17/2014   HDL 32.60 (L) 03/17/2014   LDLCALC 61 03/17/2014   TRIG 270.0 (H) 03/17/2014   CHOLHDL 5 03/17/2014   Lab Results  Component Value Date   HGBA1C 5.8 03/31/2015   No results found for: VITAMINB12 Lab Results  Component Value Date   TSH 1.16 03/31/2015     ASSESSMENT AND PLAN 36 y.o. year old male  has a past medical history of Anxiety (2018), Hypertension, and Smoker. here with     ICD-10-CM   1. OSA on CPAP  G47.33 For home use only DME continuous positive airway pressure (CPAP)   Z99.89 For home use only DME continuous positive airway pressure (CPAP)    For home use only DME continuous positive airway pressure (CPAP)        Vincent Murray is doing well on CPAP therapy. Compliance report reveals excellent complaince. He was encouraged to continue using CPAP nightly and for greater than 4 hours each night. We will update supply orders as indicated. I have also sent order for mask refitting as he would liek to try a nasal pillow. We will also provide travel CPAP order that he may send to [email or web address] or [email or web address] to obtain new travel machine. His current CPAP was set up 06/2017. He may call me in 06/2022 for new CPAP orders. Risks of untreated sleep apnea review and education materials provided.  Healthy lifestyle habits encouraged. He will follow up in 1 year, sooner if needed. He verbalizes understanding and agreement with this plan.     Orders Placed This Encounter  Procedures   For home use only DME continuous positive airway pressure (CPAP)    Supplies    Order Specific Question:   Length of Need    Answer:   Lifetime    Order Specific Question:   Patient has OSA or probable OSA    Answer:   Yes    Order Specific Question:   Is the patient currently using CPAP in the home    Answer:   Yes    Order Specific Question:   Settings    Answer:   Other see comments    Order Specific Question:   CPAP supplies needed    Answer:   Mask, headgear, cushions, filters, heated tubing and water chamber   For home use only DME continuous positive airway pressure (CPAP)    Patient needs travel machine with set pressure of 10cmH20.    Order Specific Question:   Length of Need    Answer:   Lifetime    Order Specific Question:   Patient has OSA or probable OSA    Answer:   Yes    Order Specific Question:   Is the patient currently using CPAP in the home    Answer:   Yes    Order Specific Question:   Settings    Answer:   Other see comments    Order Specific Question:   CPAP supplies needed    Answer:   Mask, headgear, cushions, filters, heated tubing and water chamber   For home use only DME continuous positive airway pressure (CPAP)    Mask refitting, would like to try nasal pillow    Order Specific Question:   Length of Need    Answer:   Lifetime  Order Specific Question:   Patient has OSA or probable OSA    Answer:   Yes    Order Specific Question:   Is the patient currently using CPAP in the home    Answer:   Yes    Order Specific Question:   Settings    Answer:   Other see comments    Order Specific Question:   CPAP supplies needed    Answer:   Mask, headgear, cushions, filters, heated tubing and water chamber     No orders of the defined types were placed in this  encounter.     Shawnie Dappermy Delyla Sandeen, FNP-C 01/03/2022, 7:57 AM [facility] [address], [address] [place], [place] [id number] [phone]

## 2022-01-03 ENCOUNTER — Other Ambulatory Visit: Payer: Self-pay

## 2022-01-03 ENCOUNTER — Ambulatory Visit (INDEPENDENT_AMBULATORY_CARE_PROVIDER_SITE_OTHER): Payer: No Typology Code available for payment source | Admitting: Family Medicine

## 2022-01-03 ENCOUNTER — Encounter: Payer: Self-pay | Admitting: Family Medicine

## 2022-01-03 VITALS — BP 113/68 | HR 72 | Ht 72.0 in | Wt 280.0 lb

## 2022-01-03 DIAGNOSIS — Z9989 Dependence on other enabling machines and devices: Secondary | ICD-10-CM

## 2022-01-03 DIAGNOSIS — G4733 Obstructive sleep apnea (adult) (pediatric): Secondary | ICD-10-CM

## 2022-01-03 NOTE — Progress Notes (Signed)
CM sent to [facility] 

## 2022-02-06 LAB — LAB REPORT - SCANNED
Calcium: 9.9
EGFR: 91
HM HIV Screening: NEGATIVE
TSH: 0.9 (ref 0.41–5.90)

## 2022-08-03 LAB — LAB REPORT - SCANNED: A1c: 5.9

## 2022-10-01 ENCOUNTER — Telehealth: Payer: Self-pay | Admitting: *Deleted

## 2022-10-01 NOTE — Telephone Encounter (Signed)
.   Order signed by Dr Rexene Alberts for cpap supplies and cpap machine.  Needed sleep study.  Faxed to [phone] receive fax confirmation.

## 2023-01-02 NOTE — Progress Notes (Unsigned)
PATIENT: Vincent Murray DOB: 1986-01-21  REASON FOR VISIT: follow up HISTORY FROM: patient  No chief complaint on file.    HISTORY OF PRESENT ILLNESS:  01/02/23 ALL: Vincent Murray returns for follow up for OSA on CPAP.   He is eligible for a new machine. Current machine set up in 06/2017.   01/03/2022 ALL: Vincent Murray returns for follow up for OSA on CPAP. He continues to do well. He is using CPAp nightly. He can not sleep without it. He usually uses a full face mask but wishes to try nasal pillow as he has more congestion and feels the pressure of the mask around the nasal bridge contributes. He would also like a travel machine as he goes camping often.     08/01/2021 ALL:  Vincent Murray is a 37 y.o. male here today for follow up for OSA on CPAP.  He continues to do well with CPAP therapy.  He uses his machine every night.  He denies any difficulty with therapy or obtaining supplies.  He is feeling well today and without concerns.  Compliance report dated 11/27/2020 through 12/26/2020 reveals that he used CPAP 30 of the past 30 days for compliance of 100%.  4-hour compliance at 100%.  Average usage was 7 hours and 31 minutes.  Residual AHI was 1.2 on a set pressure of 10 cm of water and EPR of 3.  There was no significant leak noted.  HISTORY: (copied from previous note)  Vincent Murray is a very pleasant 37 year old right-handed gentleman with an underlying medical history of hypertension, anxiety disorder, smoking, and obesity, who presents for follow-up consultation of his obstructive sleep apnea, after recent sleep study testing. The patient is unaccompanied today.  He presents after a longer gap of over 2 years, had canceled an appointment on 02/26/2018 with Vincent Murray, nurse practitioner.  I last saw him on 08/26/2017, at which time he was compliant with his CPAP and reported improved daytime somnolence, better sleep quality and sleep consolidation.  He had stopped smoking.  He  had recently received an epidural steroid injection for low back pain under Dr. Patrice Paradise.    Today, 12/22/2019: I reviewed his CPAP compliance data from 11/22/2019 through 12/21/2019 which is a total of 30 days, during which time he used his machine every night with percent use days greater than 4 hours at 93%, indicating excellent compliance with an average usage of 7 hours and 6 minutes, residual AHI at goal at 1.9/h, leak acceptable with a 95th percentile at 14.8 L/min on a pressure of 10 cm with EPR of 3.  He reports he needs new supplies, has been using a nasal mask.  He reports having had a job change and interim insurance change.  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  He has been compliant with treatment and continues to benefit from it, he generally speaking does not skip a night.   REVIEW OF SYSTEMS: Out of a complete 14 system review of symptoms, the patient complains only of the following symptoms, none and all other reviewed systems are negative.  ESS: 2  ALLERGIES: Allergies  Allergen Reactions   Gluten Meal    Penicillins Rash    REACTION: rash (ALL CILLINS)    HOME MEDICATIONS: Outpatient Medications Prior to Visit  Medication Sig Dispense Refill   buPROPion (WELLBUTRIN XL) 150 MG 24 hr tablet TAKE  1 TABLET (150 MG) BY MOUTH DAILY IN THE MORNING  1   lisinopril (PRINIVIL,ZESTRIL) 40 MG tablet TAKE ONE TABLET EVERY DAY 90 tablet 1   No facility-administered medications prior to visit.    PAST MEDICAL HISTORY: Past Medical History:  Diagnosis Date   Anxiety 2018   Hypertension    Smoker     PAST SURGICAL HISTORY: Past Surgical History:  Procedure Laterality Date   WISDOM TOOTH EXTRACTION      FAMILY HISTORY: Family History  Problem Relation Age of Onset   Stroke Maternal Grandfather    Heart disease Maternal Grandfather    Hypertension Other        runs in family    Hypertension Mother    Hypertension Father     SOCIAL HISTORY: Social History   Socioeconomic History   Marital status: Married    Spouse name: Megan    Number of children: 0   Years of education: HS   Highest education level: Not on file  Occupational History   Occupation: [employer]  Tobacco Use   Smoking status: Every Day    Packs/day: 0.00    Types: Cigarettes    Last attempt to quit: 06/30/2017    Years since quitting: 5.5   Smokeless tobacco: Former  Substance and Sexual Activity   Alcohol use: Yes    Alcohol/week: 0.0 standard drinks of alcohol    Comment: rare   Drug use: No   Sexual activity: Yes  Other Topics Concern   Not on file  Social History Narrative   Once a day caffeine use    R handed   Lives home with wife   Social Determinants of Health   Financial Resource Strain: Not on file  Food Insecurity: Not on file  Transportation Needs: Not on file  Physical Activity: Not on file  Stress: Not on file  Social Connections: Not on file  Intimate Partner Violence: Not on file     PHYSICAL EXAM  There were no vitals filed for this visit.   There is no height or weight on file to calculate BMI.  Generalized: Well developed, in no acute distress  Cardiology: normal rate and rhythm, no murmur noted Respiratory: clear to auscultation bilaterally  Neurological examination  Mentation: Alert oriented to time, place, history taking. Follows all commands speech and language fluent Cranial nerve II-XII: Pupils were equal round reactive to light. Extraocular movements were full, visual field were full  Motor: The motor testing reveals 5 over 5 strength of all 4 extremities. Good symmetric motor tone is noted throughout.  Gait and station: Gait is normal.    DIAGNOSTIC DATA (LABS, IMAGING, TESTING) - I reviewed patient records, labs, notes, testing and imaging myself where available.      No data to display           Lab Results   Component Value Date   WBC 10.5 03/31/2015   HGB 13.3 03/31/2015   HCT 39.2 03/31/2015   MCV 84.5 03/31/2015   PLT 288.0 03/31/2015      Component Value Date/Time   NA 134 (L) 03/31/2015 1205   K 4.4 03/31/2015 1205   CL 101 03/31/2015 1205   CO2 28 03/31/2015 1205   GLUCOSE 87 03/31/2015 1205   BUN 16 03/31/2015 1205   CREATININE 1.17 03/31/2015 1205   CALCIUM 10.0 03/31/2015 1205   PROT 7.7 03/31/2015 1205   ALBUMIN 4.5 03/31/2015 1205   AST 26 03/31/2015 1205   ALT  24 03/31/2015 1205   ALKPHOS 47 03/31/2015 1205   BILITOT 0.5 03/31/2015 1205   Lab Results  Component Value Date   CHOL 148 03/17/2014   HDL 32.60 (L) 03/17/2014   LDLCALC 61 03/17/2014   TRIG 270.0 (H) 03/17/2014   CHOLHDL 5 03/17/2014   Lab Results  Component Value Date   HGBA1C 5.8 03/31/2015   No results found for: "VITAMINB12" Lab Results  Component Value Date   TSH 1.16 03/31/2015     ASSESSMENT AND PLAN 37 y.o. year old male  has a past medical history of Anxiety (2018), Hypertension, and Smoker. here with   No diagnosis found.     Vincent Murray is doing well on CPAP therapy. Compliance report reveals excellent complaince. He was encouraged to continue using CPAP nightly and for greater than 4 hours each night. We will update supply orders as indicated. I have also sent order for mask refitting as he would liek to try a nasal pillow. We will also provide travel CPAP order that he may send to [email or web address] or [email or web address] to obtain new travel machine. His current CPAP was set up 06/2017. He may call me in 06/2022 for new CPAP orders. Risks of untreated sleep apnea review and education materials provided. Healthy lifestyle habits encouraged. He will follow up in 1 year, sooner if needed. He verbalizes understanding and agreement with this plan.     No orders of the defined types were placed in this encounter.    No orders of the defined types were placed in this encounter.     Debbora Presto, FNP-C 01/02/2023, 8:04 AM [facility] [address] [place], [place] [address] [phone]

## 2023-01-02 NOTE — Patient Instructions (Incomplete)

## 2023-01-03 ENCOUNTER — Ambulatory Visit: Payer: BC Managed Care – PPO | Admitting: Family Medicine

## 2023-01-03 ENCOUNTER — Encounter: Payer: Self-pay | Admitting: Family Medicine

## 2023-01-03 VITALS — BP 121/67 | HR 73 | Ht 73.0 in | Wt 280.5 lb

## 2023-01-03 DIAGNOSIS — G4733 Obstructive sleep apnea (adult) (pediatric): Secondary | ICD-10-CM | POA: Diagnosis not present

## 2023-02-19 ENCOUNTER — Emergency Department (HOSPITAL_BASED_OUTPATIENT_CLINIC_OR_DEPARTMENT_OTHER): Payer: BC Managed Care – PPO | Admitting: Radiology

## 2023-02-19 ENCOUNTER — Other Ambulatory Visit: Payer: Self-pay

## 2023-02-19 DIAGNOSIS — M5410 Radiculopathy, site unspecified: Secondary | ICD-10-CM | POA: Diagnosis not present

## 2023-02-19 DIAGNOSIS — Z79899 Other long term (current) drug therapy: Secondary | ICD-10-CM | POA: Diagnosis not present

## 2023-02-19 DIAGNOSIS — H538 Other visual disturbances: Secondary | ICD-10-CM | POA: Insufficient documentation

## 2023-02-19 DIAGNOSIS — Z87891 Personal history of nicotine dependence: Secondary | ICD-10-CM | POA: Diagnosis not present

## 2023-02-19 DIAGNOSIS — I10 Essential (primary) hypertension: Secondary | ICD-10-CM | POA: Diagnosis not present

## 2023-02-19 DIAGNOSIS — M79602 Pain in left arm: Secondary | ICD-10-CM | POA: Diagnosis present

## 2023-02-19 LAB — CBC
HCT: 39.6 % (ref 39.0–52.0)
Hemoglobin: 13.4 g/dL (ref 13.0–17.0)
MCH: 29.1 pg (ref 26.0–34.0)
MCHC: 33.8 g/dL (ref 30.0–36.0)
MCV: 85.9 fL (ref 80.0–100.0)
Platelets: 278 10*3/uL (ref 150–400)
RBC: 4.61 MIL/uL (ref 4.22–5.81)
RDW: 12.7 % (ref 11.5–15.5)
WBC: 11.9 10*3/uL — ABNORMAL HIGH (ref 4.0–10.5)
nRBC: 0 % (ref 0.0–0.2)

## 2023-02-19 LAB — BASIC METABOLIC PANEL
Anion gap: 11 (ref 5–15)
BUN: 18 mg/dL (ref 6–20)
CO2: 23 mmol/L (ref 22–32)
Calcium: 10 mg/dL (ref 8.9–10.3)
Chloride: 102 mmol/L (ref 98–111)
Creatinine, Ser: 0.95 mg/dL (ref 0.61–1.24)
GFR, Estimated: >60 mL/min (ref >60)
Glucose, Bld: 123 mg/dL — ABNORMAL HIGH (ref 70–99)
Potassium: 3.9 mmol/L (ref 3.5–5.1)
Sodium: 136 mmol/L (ref 135–145)

## 2023-02-19 LAB — TROPONIN I (HIGH SENSITIVITY): Troponin I (High Sensitivity): 3 ng/L (ref <18)

## 2023-02-19 NOTE — ED Triage Notes (Signed)
Pt arrived POV, caox4, NAD stating approx 1800 while walking he had a sudden onset of pain described as tightness going from his L arm up to his left shoulder and L side of neck. Pt states he also had experienced a couple occasions today where his vision "seemed off" but that he could "see perfectly clear," denies at present.

## 2023-02-20 ENCOUNTER — Emergency Department (HOSPITAL_BASED_OUTPATIENT_CLINIC_OR_DEPARTMENT_OTHER)
Admission: EM | Admit: 2023-02-20 | Discharge: 2023-02-20 | Disposition: A | Discharge status: Home / Self Care | Payer: BC Managed Care – PPO | Attending: Emergency Medicine | Admitting: Emergency Medicine

## 2023-02-20 ENCOUNTER — Emergency Department (HOSPITAL_BASED_OUTPATIENT_CLINIC_OR_DEPARTMENT_OTHER): Payer: BC Managed Care – PPO

## 2023-02-20 DIAGNOSIS — M792 Neuralgia and neuritis, unspecified: Secondary | ICD-10-CM

## 2023-02-20 LAB — TROPONIN I (HIGH SENSITIVITY): Troponin I (High Sensitivity): 3 ng/L (ref <18)

## 2023-02-20 MED ORDER — MELOXICAM 15 MG PO TABS: 15.0000 mg | ORAL_TABLET | Freq: Every day | ORAL | 0 refills | Status: DC

## 2023-02-20 NOTE — ED Notes (Signed)
Reviewed AVS/discharge instruction with patient. Time allotted for and all questions answered. Patient is agreeable for d/c and escorted to ed exit by staff.  

## 2023-02-20 NOTE — ED Provider Notes (Signed)
[facility] Provider Note   CSN: [id number] Arrival date & time: 02/19/23  2021     History  Chief Complaint  Patient presents with   Tightness in left arm     Vincent Murray is a 37 y.o. male.  The history is provided by the patient.  Illness Location:  Left arm Quality:  Shoot pain with tightness Severity:  Moderate Onset quality:  Sudden Duration: hours. Timing:  Constant Progression:  Resolved Chronicity:  New Relieved by:  Nothing Worsened by:  Nothing Ineffective treatments:  None Associated symptoms: no abdominal pain, no chest pain, no congestion, no cough, no diarrhea, no ear pain, no fatigue, no fever, no headaches, no loss of consciousness, no myalgias, no nausea, no rash, no rhinorrhea, no shortness of breath, no sore throat, no vomiting and no wheezing   Arm pain and 2 incidences of feeling vision wasn't right B.  No weakness no numbness no changes in speech.  No CP, no SOb.  No n/v/d.      Past Medical History:  Diagnosis Date   Anxiety 2018   Hypertension    Smoker      Home Medications Prior to Admission medications   Medication Sig Start Date End Date Taking? Authorizing Provider  meloxicam (MOBIC) 15 MG tablet Take 1 tablet (15 mg total) by mouth daily. 02/20/23  Yes Runell Kovich, MD  buPROPion (WELLBUTRIN XL) 150 MG 24 hr tablet TAKE 1 TABLET (150 MG) BY MOUTH DAILY IN THE MORNING 03/20/17   [provider]  lisinopril (PRINIVIL,ZESTRIL) 40 MG tablet TAKE ONE TABLET EVERY DAY 01/09/17   Copland, Frederico Hamman, MD      Allergies    Gluten meal and Penicillins    Review of Systems   Review of Systems  Constitutional:  Negative for fatigue and fever.  HENT:  Negative for congestion, ear pain, rhinorrhea and sore throat.   Eyes:  Negative for photophobia, pain and redness.  Respiratory:  Negative for cough, shortness of breath and wheezing.   Cardiovascular:  Negative for chest pain.   Gastrointestinal:  Negative for abdominal pain, diarrhea, nausea and vomiting.  Musculoskeletal:  Negative for myalgias.  Skin:  Negative for rash.  Neurological:  Negative for loss of consciousness and headaches.  All other systems reviewed and are negative.   Physical Exam Updated Vital Signs BP 115/68   Pulse (!) 59   Temp 97.9 F (36.6 C) (Oral)   Resp 16   Ht 6' (1.829 m)   Wt 124.7 kg   SpO2 97%   BMI 37.30 kg/m  Physical Exam Vitals and nursing note reviewed.  Constitutional:      General: He is not in acute distress.    Appearance: Normal appearance. He is well-developed. He is not diaphoretic.  HENT:     Head: Normocephalic and atraumatic.     Nose: Nose normal.     Mouth/Throat:     Mouth: Mucous membranes are moist.     Pharynx: Oropharynx is clear.  Eyes:     Extraocular Movements: Extraocular movements intact.     Conjunctiva/sclera: Conjunctivae normal.     Pupils: Pupils are equal, round, and reactive to light.  Cardiovascular:     Rate and Rhythm: Normal rate and regular rhythm.  Pulmonary:     Effort: Pulmonary effort is normal.     Breath sounds: Normal breath sounds. No wheezing or rales.  Abdominal:     General: Bowel sounds are normal.  Palpations: Abdomen is soft.     Tenderness: There is no abdominal tenderness. There is no guarding or rebound.  Musculoskeletal:        General: Normal range of motion.     Cervical back: Normal range of motion and neck supple.  Skin:    General: Skin is warm and dry.  Neurological:     General: No focal deficit present.     Mental Status: He is alert and oriented to person, place, and time.     Cranial Nerves: No cranial nerve deficit.     Sensory: No sensory deficit.     Coordination: Coordination normal.     Deep Tendon Reflexes: Reflexes normal.  Psychiatric:        Mood and Affect: Mood normal.        Behavior: Behavior normal.     ED Results / Procedures / Treatments   Labs (all labs  ordered are listed, but only abnormal results are displayed) Results for orders placed or performed during the hospital encounter of [id number]  Basic metabolic panel  Result Value Ref Range   Sodium 136 135 - 145 mmol/L   Potassium 3.9 3.5 - 5.1 mmol/L   Chloride 102 98 - 111 mmol/L   CO2 23 22 - 32 mmol/L   Glucose, Bld 123 (H) 70 - 99 mg/dL   BUN 18 6 - 20 mg/dL   Creatinine, Ser 0.95 0.61 - 1.24 mg/dL   Calcium 10.0 8.9 - 10.3 mg/dL   GFR, Estimated >60 >60 mL/min   Anion gap 11 5 - 15  CBC  Result Value Ref Range   WBC 11.9 (H) 4.0 - 10.5 K/uL   RBC 4.61 4.22 - 5.81 MIL/uL   Hemoglobin 13.4 13.0 - 17.0 g/dL   HCT 39.6 39.0 - 52.0 %   MCV 85.9 80.0 - 100.0 fL   MCH 29.1 26.0 - 34.0 pg   MCHC 33.8 30.0 - 36.0 g/dL   RDW 12.7 11.5 - 15.5 %   Platelets 278 150 - 400 K/uL   nRBC 0.0 0.0 - 0.2 %  Troponin I (High Sensitivity)  Result Value Ref Range   Troponin I (High Sensitivity) 3 <18 ng/L  Troponin I (High Sensitivity)  Result Value Ref Range   Troponin I (High Sensitivity) 3 <18 ng/L   CT Head Wo Contrast  Result Date: 02/20/2023 CLINICAL DATA:  Neck and left arm pain, initial encounter EXAM: CT HEAD WITHOUT CONTRAST TECHNIQUE: Contiguous axial images were obtained from the base of the skull through the vertex without intravenous contrast. RADIATION DOSE REDUCTION: This exam was performed according to the departmental dose-optimization program which includes automated exposure control, adjustment of the mA and/or kV according to patient size and/or use of iterative reconstruction technique. COMPARISON:  None Available. FINDINGS: Brain: No evidence of acute infarction, hemorrhage, hydrocephalus, extra-axial collection or mass lesion/mass effect. Vascular: No hyperdense vessel or unexpected calcification. Skull: Normal. Negative for fracture or focal lesion. Sinuses/Orbits: No acute finding. Other: None. IMPRESSION: No acute intracranial abnormality noted. Electronically Signed    By: Inez Catalina M.D.   On: 02/20/2023 01:19   DG Chest 2 View  Result Date: 02/19/2023 CLINICAL DATA:  Chest pain EXAM: CHEST - 2 VIEW COMPARISON:  None Available. FINDINGS: The heart size and mediastinal contours are within normal limits. Both lungs are clear. The visualized skeletal structures are unremarkable. IMPRESSION: No active cardiopulmonary disease. Electronically Signed   By: Inez Catalina M.D.   On: 02/19/2023 20:57  EKG EKG Interpretation  Date/Time:  Tuesday February 19 2023 20:33:38 EST Ventricular Rate:  80 PR Interval:  158 QRS Duration: 90 QT Interval:  380 QTC Calculation: 438 R Axis:   40 Text Interpretation: Normal sinus rhythm Normal ECG No previous ECGs available Confirmed by Cindee Lame [phone]) on 02/19/2023 8:49:38 PM  Radiology CT Head Wo Contrast  Result Date: 02/20/2023 CLINICAL DATA:  Neck and left arm pain, initial encounter EXAM: CT HEAD WITHOUT CONTRAST TECHNIQUE: Contiguous axial images were obtained from the base of the skull through the vertex without intravenous contrast. RADIATION DOSE REDUCTION: This exam was performed according to the departmental dose-optimization program which includes automated exposure control, adjustment of the mA and/or kV according to patient size and/or use of iterative reconstruction technique. COMPARISON:  None Available. FINDINGS: Brain: No evidence of acute infarction, hemorrhage, hydrocephalus, extra-axial collection or mass lesion/mass effect. Vascular: No hyperdense vessel or unexpected calcification. Skull: Normal. Negative for fracture or focal lesion. Sinuses/Orbits: No acute finding. Other: None. IMPRESSION: No acute intracranial abnormality noted. Electronically Signed   By: Inez Catalina M.D.   On: 02/20/2023 01:19   DG Chest 2 View  Result Date: 02/19/2023 CLINICAL DATA:  Chest pain EXAM: CHEST - 2 VIEW COMPARISON:  None Available. FINDINGS: The heart size and mediastinal contours are within normal limits. Both  lungs are clear. The visualized skeletal structures are unremarkable. IMPRESSION: No active cardiopulmonary disease. Electronically Signed   By: Inez Catalina M.D.   On: 02/19/2023 20:57    Procedures Procedures    Medications Ordered in ED Medications - No data to display  ED Course/ Medical Decision Making/ A&P                             Medical Decision Making Felt his vision was off and had arm pain.  No Weakness. No numbness   Amount and/or Complexity of Data Reviewed Independent Historian: spouse    Details: See above  External Data Reviewed: notes.    Details: Previous notes reviewed  Labs: ordered.    Details: All labs reviewed: 2 negative troponins 3/3. White count slight elevation 11.9, normal hemoglobin and platelet 278.  Normal sodium 136, normal potassium 3.9, normal creatinine.   Radiology: ordered and independent interpretation performed.    Details: Negative head CT by me  ECG/medicine tests: ordered and independent interpretation performed. Decision-making details documented in ED Course.  Risk Prescription drug management. Risk Details: Patient ruled out for MI, heart score is 1 low risk for MACE.  PERC negative Wells 0 highly doubt PE, symptoms are not consistent with PE.  Pain appears to be from the neck and radicular in nature. Vision is normal.  Cranial nerves are intact and CT is normal.  I do not believe this is a stroke.  I believe patient needs to follow up with eye care professional and PMD.  Stable for discharge.  Strict return     Final Clinical Impression(s) / ED Diagnoses Final diagnoses:  Radicular pain in left arm   Return for intractable cough, coughing up blood, fevers > 100.4 unrelieved by medication, shortness of breath, intractable vomiting, chest pain, shortness of breath, weakness, numbness, changes in speech, facial asymmetry, abdominal pain, passing out, Inability to tolerate liquids or food, cough, altered mental status or any concerns.  No signs of systemic illness or infection. The patient is nontoxic-appearing on exam and vital signs are within normal limits.  I have reviewed  the triage vital signs and the nursing notes. Pertinent labs & imaging results that were available during my care of the patient were reviewed by me and considered in my medical decision making (see chart for details). After history, exam, and medical workup I feel the patient has been appropriately medically screened and is safe for discharge home. Pertinent diagnoses were discussed with the patient. Patient was given return precautions.      Rx / DC Orders ED Discharge Orders          Ordered    meloxicam (MOBIC) 15 MG tablet  Daily        02/20/23 0215              Ac Colan, MD 02/20/23 [id number]

## 2023-07-29 ENCOUNTER — Ambulatory Visit (INDEPENDENT_AMBULATORY_CARE_PROVIDER_SITE_OTHER): Payer: 59 | Admitting: Family Medicine

## 2023-07-29 ENCOUNTER — Encounter: Payer: Self-pay | Admitting: Family Medicine

## 2023-07-29 VITALS — BP 129/74 | HR 77 | Ht 72.0 in | Wt 272.4 lb

## 2023-07-29 DIAGNOSIS — G5600 Carpal tunnel syndrome, unspecified upper limb: Secondary | ICD-10-CM | POA: Insufficient documentation

## 2023-07-29 DIAGNOSIS — F17200 Nicotine dependence, unspecified, uncomplicated: Secondary | ICD-10-CM

## 2023-07-29 DIAGNOSIS — E669 Obesity, unspecified: Secondary | ICD-10-CM | POA: Diagnosis not present

## 2023-07-29 DIAGNOSIS — L309 Dermatitis, unspecified: Secondary | ICD-10-CM

## 2023-07-29 DIAGNOSIS — F419 Anxiety disorder, unspecified: Secondary | ICD-10-CM

## 2023-07-29 DIAGNOSIS — I10 Essential (primary) hypertension: Secondary | ICD-10-CM

## 2023-07-29 DIAGNOSIS — G5603 Carpal tunnel syndrome, bilateral upper limbs: Secondary | ICD-10-CM | POA: Diagnosis not present

## 2023-07-29 LAB — POCT GLYCOSYLATED HEMOGLOBIN (HGB A1C): HbA1c POC (<> result, manual entry): 5.5 % (ref 4.0–5.6)

## 2023-07-29 MED ORDER — TRIAMCINOLONE ACETONIDE 0.1 % EX OINT: 1.0000 | TOPICAL_OINTMENT | Freq: Two times a day (BID) | CUTANEOUS | 1 refills | Status: DC

## 2023-07-29 NOTE — Patient Instructions (Addendum)
It was nice to see you today,  We addressed the following topics today: - I will refill your medications when they are due.   - you will need to come in for labs in the the next week or two.   - follow up with me in 3 months.  - I sent in a prescription for your steroid cream.  Use it twice a day for two weeks and then use twice a week after that.    Have a great day,  Frederic Jericho, MD

## 2023-07-29 NOTE — Assessment & Plan Note (Signed)
Continue wellbutrin

## 2023-07-29 NOTE — Assessment & Plan Note (Signed)
Continue lisinopril

## 2023-07-29 NOTE — Progress Notes (Signed)
New Patient Office Visit  Subjective    Patient ID: Vincent Murray, male    DOB: 01-23-1986  Age: 37 y.o. MRN: [id number]  CC:  Chief Complaint  Patient presents with   New Patient (Initial Visit)    HPI Vincent Murray presents to establish care Was with Dr. William Hamburger off [address].  Has not seen her in over a year.   Eczema - right leg.  Has been putting CeraVe on it.  Previously was putting topical steroid on it when he had a prescription.[name] -had fungal infection on his left axilla in the past.  Treat with antifungal.  Not currently bothering him.  Osa - cpap.  Has it at home.  No complaints.  Is compliant with it every night.  HTN-takes lisinopril.  Just had a refill.. bp controlled.  Refill as needed.   Anxiety -takes Wellbutrin.  No complaints.  Just refilled it..  refill as needed.   Patient complains of numbness and hands that occurs when he is using them or when he is riding his motorcycle.  He wears splints at night which helps somewhat.  Symptoms improved after he stopped climbing up ladders at work and stopped riding his motorcycle.  We discussed treatment options and patient wishes to hold off on orthopedic referral at this time.  Patient has been told that he was "almost" in the prediabetic range in the past.  Has cut out carbohydrates and eats mostly proteins and fats now.  PMH: eczema, htn, anxiety, osa.    PSH: wisdom teeth  FH: stroke MGF; HTN - mother, MGF.   Tobacco use: former smoker 1ppd. Recently quit.   Alcohol use: rare Drug use: no Marital status: married.  No children.   Employment: self employed - Management consultant.   Sexual hx: wife has iud.      Outpatient Encounter Medications as of 07/29/2023  Medication Sig   buPROPion (WELLBUTRIN XL) 150 MG 24 hr tablet TAKE 1 TABLET (150 MG) BY MOUTH DAILY IN THE MORNING   lisinopril (PRINIVIL,ZESTRIL) 40 MG tablet TAKE ONE TABLET EVERY DAY   triamcinolone ointment (KENALOG) 0.1 % Apply 1  Application topically 2 (two) times daily.   [DISCONTINUED] meloxicam (MOBIC) 15 MG tablet Take 1 tablet (15 mg total) by mouth daily.   No facility-administered encounter medications on file as of 07/29/2023.    Past Medical History:  Diagnosis Date   Anxiety 2018   Hypertension    Smoker     Past Surgical History:  Procedure Laterality Date   WISDOM TOOTH EXTRACTION      Family History  Problem Relation Age of Onset   Stroke Maternal Grandfather    Heart disease Maternal Grandfather    Hypertension Other        runs in family   Hypertension Mother    Hypertension Father     Social History   Socioeconomic History   Marital status: Married    Spouse name: Vincent Murray    Number of children: 0   Years of education: HS   Highest education level: Not on file  Occupational History   Occupation: [employer]  Tobacco Use   Smoking status: Every Day    Current packs/day: 0.00    Types: Cigarettes    Last attempt to quit: 06/30/2017    Years since quitting: 6.0   Smokeless tobacco: Former  Substance and Sexual Activity   Alcohol use: Yes    Alcohol/week: 0.0 standard drinks of  alcohol    Comment: rare   Drug use: No   Sexual activity: Yes  Other Topics Concern   Not on file  Social History Narrative   Once a day caffeine use    R handed   Lives home with wife   Social Determinants of Health   Financial Resource Strain: Not on file  Food Insecurity: Not on file  Transportation Needs: Not on file  Physical Activity: Not on file  Stress: Not on file  Social Connections: Not on file  Intimate Partner Violence: Not on file    ROS      Objective    BP 129/74   Pulse 77   Ht 6' (1.829 m)   Wt 272 lb 6.4 oz (123.6 kg)   SpO2 98%   BMI 36.94 kg/m   Physical Exam General: Alert, oriented HEENT: PERRLA, EOMI CV: Regular rate and rhythm Pulmonary: Skin bilaterally GI: Soft, normal bowel sounds MSK: Strength equal bilaterally Psych: Pleasant  affect Skin: Very mild scaly rash on the outer anterior right shin.      Assessment & Plan:   Obesity (BMI 30-39.9) -     POCT glycosylated hemoglobin (Hb A1C) -     Lipid panel; Future  Bilateral carpal tunnel syndrome Assessment & Plan: Pt does not want referral at this time to orthopedics d/t symptoms improving.     Eczema, unspecified type  Anxiety Assessment & Plan: Continue wellbutrin   Essential hypertension Assessment & Plan: Continue lisinopril   TOBACCO USE Assessment & Plan: Is a former smoker.  1ppd for 15+ years   Other orders -     Triamcinolone Acetonide; Apply 1 Application topically 2 (two) times daily.  Dispense: 30 g; Refill: 1    Return in about 3 months (around 10/29/2023).   Sandre Kitty, MD

## 2023-07-29 NOTE — Assessment & Plan Note (Signed)
Pt does not want referral at this time to orthopedics d/t symptoms improving.

## 2023-07-29 NOTE — Assessment & Plan Note (Signed)
Is a former smoker.  1ppd for 15+ years

## 2023-08-01 ENCOUNTER — Other Ambulatory Visit: Payer: Self-pay | Admitting: Family Medicine

## 2023-08-01 DIAGNOSIS — Z0000 Encounter for general adult medical examination without abnormal findings: Secondary | ICD-10-CM

## 2023-08-01 DIAGNOSIS — I10 Essential (primary) hypertension: Secondary | ICD-10-CM

## 2023-08-07 ENCOUNTER — Other Ambulatory Visit: Payer: 59

## 2023-08-07 DIAGNOSIS — E669 Obesity, unspecified: Secondary | ICD-10-CM

## 2023-08-07 DIAGNOSIS — Z0000 Encounter for general adult medical examination without abnormal findings: Secondary | ICD-10-CM

## 2023-08-07 DIAGNOSIS — I10 Essential (primary) hypertension: Secondary | ICD-10-CM

## 2023-09-16 ENCOUNTER — Encounter: Payer: Self-pay | Admitting: Family Medicine

## 2023-10-29 ENCOUNTER — Ambulatory Visit (INDEPENDENT_AMBULATORY_CARE_PROVIDER_SITE_OTHER): Payer: 59 | Admitting: Family Medicine

## 2023-10-29 ENCOUNTER — Encounter: Payer: Self-pay | Admitting: Family Medicine

## 2023-10-29 VITALS — BP 121/76 | HR 74 | Ht 72.0 in | Wt 276.1 lb

## 2023-10-29 DIAGNOSIS — I10 Essential (primary) hypertension: Secondary | ICD-10-CM

## 2023-10-29 DIAGNOSIS — E669 Obesity, unspecified: Secondary | ICD-10-CM | POA: Diagnosis not present

## 2023-10-29 DIAGNOSIS — J300 Vasomotor rhinitis: Secondary | ICD-10-CM | POA: Diagnosis not present

## 2023-10-29 DIAGNOSIS — D229 Melanocytic nevi, unspecified: Secondary | ICD-10-CM | POA: Diagnosis not present

## 2023-10-29 DIAGNOSIS — F419 Anxiety disorder, unspecified: Secondary | ICD-10-CM

## 2023-10-29 DIAGNOSIS — L309 Dermatitis, unspecified: Secondary | ICD-10-CM

## 2023-10-29 MED ORDER — CLOBETASOL PROPIONATE 0.05 % EX CREA
1.0000 | TOPICAL_CREAM | Freq: Two times a day (BID) | CUTANEOUS | 3 refills | Status: AC
Start: 2023-10-29 — End: ?

## 2023-10-29 MED ORDER — CLOBETASOL PROPIONATE 0.05 % EX CREA: 1.0000 | TOPICAL_CREAM | Freq: Two times a day (BID) | CUTANEOUS | 0 refills | Status: DC

## 2023-10-29 MED ORDER — AZELASTINE HCL 0.1 % NA SOLN
1.0000 | Freq: Every day | NASAL | 12 refills | Status: AC
Start: 2023-10-29 — End: ?

## 2023-10-29 MED ORDER — LISINOPRIL 40 MG PO TABS
40.0000 mg | ORAL_TABLET | Freq: Every day | ORAL | 3 refills | Status: DC
Start: 2023-10-29 — End: 2024-09-21

## 2023-10-29 MED ORDER — BUPROPION HCL ER (XL) 150 MG PO TB24
150.0000 mg | ORAL_TABLET | Freq: Every day | ORAL | 3 refills | Status: DC
Start: 2023-10-29 — End: 2024-07-07

## 2023-10-29 NOTE — Assessment & Plan Note (Signed)
Picture in chart.  Patient reports change in size.  Border is irregular.  Referral to dermatology placed.

## 2023-10-29 NOTE — Assessment & Plan Note (Signed)
Switching triamcinolone to clobetasol

## 2023-10-29 NOTE — Assessment & Plan Note (Signed)
Prescribed azelastine spray which patient has used previously at night.

## 2023-10-29 NOTE — Assessment & Plan Note (Addendum)
Recommended reduced calorie diet.  Continue to try and limit carbohydrate intake.  Referral to nutritionist placed.  Patient to look into what obesity medications if any are covered by his insurance.

## 2023-10-29 NOTE — Patient Instructions (Addendum)
It was nice to see you today,  We addressed the following topics today: -I have sent in a referral to dermatology - I have sent in referral to the healthy weight and wellness clinic. - If you have not heard from either of these in the next 3 weeks let us know - I have sent in your refills for your lisinopril and bupropion. - I have sent in new prescriptions for your azelastine spray and your clobetasol cream. - Please call your insurance company and asked them what medicines they cover, if any, for obesity treatment.  Have a great day,  Frederic Jericho, MD

## 2023-10-29 NOTE — Progress Notes (Signed)
Established Patient Office Visit  Subjective   Patient ID: Vincent Murray, male    DOB: 1986-09-14  Age: 37 y.o. MRN: [id number]  Chief Complaint  Patient presents with   Medical Management of Chronic Issues    HPI  Patient states that the steroid ointment we prescribed for his leg makes the itching worse.  He prefers the clobetasol that he has previously used.  He brought in a old prescription as an example  Patient states that he uses azelastine nasal spray mostly at night to help clear sinuses to allow him to use his CPAP.  Afrin causes rebound symptoms in the afternoon when he uses it.  Patient would like referral to the dermatologist for the mole on his left trapezius.  His wife is concerned about it changing size and the irregular shape of it.  Patient also has multiple skin tags on his back that are irritating him and he would like removed.  Weight -patient also offers any medications that can help with weight loss.  We discussed contacting his insurance company regarding what coverage they have.  Discussed calorie counting, exercise.  Patient is very active at work.  Sometimes when he diets and does not eat at lunch when working and he will feel a little lightheaded.  Patient is open to a referral to a nutritionist.   The ASCVD Risk score (Arnett DK, et al., 2019) failed to calculate for the following reasons:   The 2019 ASCVD risk score is only valid for ages 38 to 45  Health Maintenance Due  Topic Date Due   HIV Screening  Never done   Hepatitis C Screening  Never done   COVID-19 Vaccine (1 - 2023-24 season) Never done      Objective:     BP 121/76   Pulse 74   Ht 6' (1.829 m)   Wt 276 lb 1.9 oz (125.2 kg)   SpO2 99%   BMI 37.45 kg/m    Physical Exam General: Alert, oriented Skin: 2 skin tags on the posterior torso.  Irregularly bordered mole approximately 1 cm in diameter on the left trapezius.  Picture below    No results found for any visits on  10/29/23.      Assessment & Plan:   Atypical mole Assessment & Plan: Picture in chart.  Patient reports change in size.  Border is irregular.  Referral to dermatology placed.  Orders: -     Ambulatory referral to Dermatology  Obesity (BMI 30-39.9) Assessment & Plan: Recommended reduced calorie diet.  Continue to try and limit carbohydrate intake.  Referral to nutritionist placed.  Patient to look into what obesity medications if any are covered by his insurance.  Orders: -     Amb Ref to Medical Weight Management  Eczema, unspecified type Assessment & Plan: Switching triamcinolone to clobetasol  Orders: -     Clobetasol Propionate; Apply 1 Application topically 2 (two) times daily.  Dispense: 45 g; Refill: 3  Vasomotor rhinitis Assessment & Plan: Prescribed azelastine spray which patient has used previously at night.  Orders: -     Azelastine HCl; Place 1 spray into both nostrils at bedtime. Use in each nostril as directed  Dispense: 30 mL; Refill: 12  Essential hypertension -     Lisinopril; Take 1 tablet (40 mg total) by mouth daily.  Dispense: 90 tablet; Refill: 3  Anxiety -     buPROPion HCl ER (XL); Take 1 tablet (150 mg total) by mouth daily.  Dispense: 90 tablet; Refill: 3     Return in about 6 months (around 04/28/2024) for Weight.    Sandre Kitty, MD

## 2023-11-13 ENCOUNTER — Ambulatory Visit (INDEPENDENT_AMBULATORY_CARE_PROVIDER_SITE_OTHER): Payer: 59 | Admitting: Family Medicine

## 2023-11-13 ENCOUNTER — Encounter: Payer: Self-pay | Admitting: Family Medicine

## 2023-11-13 VITALS — BP 130/84 | HR 87 | Ht 72.0 in | Wt 280.2 lb

## 2023-11-13 DIAGNOSIS — S71111S Laceration without foreign body, right thigh, sequela: Secondary | ICD-10-CM | POA: Diagnosis not present

## 2023-11-13 DIAGNOSIS — S71111A Laceration without foreign body, right thigh, initial encounter: Secondary | ICD-10-CM | POA: Insufficient documentation

## 2023-11-13 MED ORDER — CEPHALEXIN 500 MG PO CAPS: 500.0000 mg | ORAL_CAPSULE | Freq: Two times a day (BID) | ORAL | 0 refills | Status: AC

## 2023-11-13 MED ORDER — DOXYCYCLINE HYCLATE 100 MG PO TABS: 100.0000 mg | ORAL_TABLET | Freq: Two times a day (BID) | ORAL | 0 refills | Status: AC

## 2023-11-13 NOTE — Patient Instructions (Addendum)
It was nice to see you today,  We addressed the following topics today: -I have prescribed doxycycline and Keflex.  Take both of these twice a day for the next 7 days - Continue to wash with soap and water daily.  When you are wearing clothes cover it with a loosefitting gauze like you are currently wearing.  You can put Vaseline over the wound to help improve healing.  When you are not wearing clothing you can let it air dry without the gauze. - If you notice any of the following: Discharge, foul smelling odor, increased swelling and tenderness or redness, fevers or chills nausea or vomiting, then go to the emergency department - I can take out your stitches next week.  Have a great day,  Frederic Jericho, MD

## 2023-11-13 NOTE — Progress Notes (Signed)
   Acute Office Visit  Subjective:     Patient ID: Vincent Murray, male    DOB: [birth year], 37 y.o.   MRN: [id number]  Chief Complaint  Patient presents with   Hip Injury    HPI  Approximately 1 week ago, the patient was cutting down trees in [facility] when he fell and landed on a ceramic flowerpot.  He suffered 2 lacerations on the right buttock.  He went to an urgent care, had the wound debrided and sutured.  He was given 3 days of antibiotics.  He has been cleaning it with soap and water, letting it air dry when not wearing close and covering it with a nonadhesive dressing when wearing closed.  He went to an urgent care today out of concern that he may have had an infection.  This was due to the redness at the site.  There has only been minimal discharge.  No worsening pain or tenderness.  No increased swelling.  He was told that he would need to go to the emergency department.  He asked if we could see him instead before deciding if he needs to go to emergency department.  ROS      Objective:    BP 130/84   Pulse 87   Ht 6' (1.829 m)   Wt 280 lb 3.2 oz (127.1 kg)   SpO2 98%   BMI 38.00 kg/m    Physical Exam General: Alert, oriented Skin: Linear laceration on the right buttock, well-healing.  Suture.  Medial to that there is a crescent shaped laceration with the skin flap a reddish-purple color.  No drainage or purulence.  Induration of the skin surrounding the suture site.  Minimal tenderness to palpation.  No fluctuance.   No results found for any visits on 11/13/23.      Assessment & Plan:   Laceration of skin of right thigh, sequela Assessment & Plan: Wound sustained approximately 5 days ago when falling on a ceramic pot.  Was stitched up the same day.  Was given 3 days of clindamycin.  Today patient presents with concerns of infection.  On exam does not appear infected.  Given the circumstances of the incident (falling on a potted plant while  cutting down a tree) and the location/shape of the wound, will treat with additional antibiotics.  Advised on reasons to seek medical attention at the ED.  Will schedule appointment for next week to remove his sutures - Keflex and doxycycline twice daily x 7 days. - Cleanse with soap and water daily.  Cover with Vaseline, apply nonadhesive dressing when wearing clothing.   Other orders -     Cephalexin; Take 1 capsule (500 mg total) by mouth 2 (two) times daily for 7 days.  Dispense: 14 capsule; Refill: 0 -     Doxycycline Hyclate; Take 1 tablet (100 mg total) by mouth 2 (two) times daily for 7 days.  Dispense: 14 tablet; Refill: 0     Return in about 9 days (around 11/22/2023) for suture removal.  .  Sandre Kitty, MD

## 2023-11-13 NOTE — Assessment & Plan Note (Signed)
Wound sustained approximately 5 days ago when falling on a ceramic pot.  Was stitched up the same day.  Was given 3 days of clindamycin.  Today patient presents with concerns of infection.  On exam does not appear infected.  Given the circumstances of the incident (falling on a potted plant while cutting down a tree) and the location/shape of the wound, will treat with additional antibiotics.  Advised on reasons to seek medical attention at the ED.  Will schedule appointment for next week to remove his sutures - Keflex and doxycycline twice daily x 7 days. - Cleanse with soap and water daily.  Cover with Vaseline, apply nonadhesive dressing when wearing clothing.

## 2023-11-21 ENCOUNTER — Ambulatory Visit (INDEPENDENT_AMBULATORY_CARE_PROVIDER_SITE_OTHER): Payer: 59 | Admitting: Family Medicine

## 2023-11-21 VITALS — BP 110/75 | HR 97 | Ht 72.0 in | Wt 280.8 lb

## 2023-11-21 DIAGNOSIS — S71111D Laceration without foreign body, right thigh, subsequent encounter: Secondary | ICD-10-CM | POA: Diagnosis not present

## 2023-11-21 NOTE — Progress Notes (Deleted)
Suture Removal  Date/Time: 11/21/2023 10:56 AM  Performed by: Sandre Kitty, MD Authorized by: Sandre Kitty, MD  Body area: lower extremity Location details: right buttock Wound Appearance: pink (See image) Sutures Removed: 8 Post-removal: dressing applied

## 2023-11-21 NOTE — Assessment & Plan Note (Signed)
Flap wound has developed an overlying eschar with some slough around the edges.  No evidence of infection.  Advised to continue washing with soap and water daily in the shower, letting it dry and covering it now on adhesive absorbent dressing when wearing clothing

## 2023-11-21 NOTE — Patient Instructions (Signed)
It was nice to see you today,  We addressed the following topics today: -Your wound looks like it is healing well.  The scab overlying the larger wound is likely going to slough off on its own.  Continue to wash with soap and water daily.  You can let it air dry without a cover, but if you are wearing clothing I would still recommend putting a dressing on top of it like the 1 you currently have on.  At least until it is no longer having significant drainage. - Follow-up with me if you feel like it is getting worse.  Signs of infection include worsening redness swelling tenderness, foul odor or increase in purulent discharge.  Fevers and chills are a sign of systemic infection.  Have a great day,  Frederic Jericho, MD

## 2023-11-21 NOTE — Progress Notes (Signed)
   Established Patient Office Visit  Subjective   Patient ID: Vincent Murray, male    DOB: 11/01/1986  Age: 37 y.o. MRN: [id number]  Chief Complaint  Patient presents with   Medical Management of Chronic Issues    HPI  Patient has finished his course of antibiotics.  Continues to try to leave the wound open without a dressing or close unless he has to.  When he wears clothing he puts a loosefitting absorbent dressing over top.  Puts Vaseline over the wound.  There is some itching and tenderness but this is mild.  Patient has not heard from the dermatology or healthy weight referrals.  The ASCVD Risk score (Arnett DK, et al., 2019) failed to calculate for the following reasons:   The 2019 ASCVD risk score is only valid for ages 33 to 18  Health Maintenance Due  Topic Date Due   HIV Screening  Never done   Hepatitis C Screening  Never done   COVID-19 Vaccine (1 - 2023-24 season) Never done      Objective:     BP 110/75   Pulse 97   Ht 6' (1.829 m)   Wt 280 lb 12.8 oz (127.4 kg)   SpO2 96%   BMI 38.08 kg/m    Physical Exam General: Alert, oriented Skin: The larger flap laceration has formed an overlying eschar with surrounding granulation tissue.  No malodor.  No purulence.  Linear laceration healing well.      No results found for any visits on 11/21/23.      Assessment & Plan:   Laceration of skin of right thigh, subsequent encounter Assessment & Plan: Flap wound has developed an overlying eschar with some slough around the edges.  No evidence of infection.  Advised to continue washing with soap and water daily in the shower, letting it dry and covering it now on adhesive absorbent dressing when wearing clothing    Suture Removal  Date/Time: 11/21/2023 10:56 AM  Performed by: Sandre Kitty, MD Authorized by: Sandre Kitty, MD  Body area: lower extremity Location details: right buttock Wound Appearance: pink (See image) Sutures Removed:  8 Post-removal: dressing applied   Return if symptoms worsen or fail to improve.    Sandre Kitty, MD

## 2024-01-08 NOTE — Patient Instructions (Addendum)
 Please continue using your CPAP regularly. While your insurance requires that you use CPAP at least 4 hours each night on 70% of the nights, I recommend, that you not skip any nights and use it throughout the night if you can. Getting used to CPAP and staying with the treatment long term does take time and patience and discipline. Untreated obstructive sleep apnea when it is moderate to severe can have an adverse impact on cardiovascular health and raise her risk for heart disease, arrhythmias, hypertension, congestive heart failure, stroke and diabetes. Untreated obstructive sleep apnea causes sleep disruption, nonrestorative sleep, and sleep deprivation. This can have an impact on your day to day functioning and cause daytime sleepiness and impairment of cognitive function, memory loss, mood disturbance, and problems focussing. Using CPAP regularly can improve these symptoms.  We will update supply orders, today. Let me know if you want to switch to a new DME.   Follow up in 1 year

## 2024-01-08 NOTE — Progress Notes (Signed)
 PATIENT: Vincent Murray DOB: Feb 07, 1986  REASON FOR VISIT: follow up HISTORY FROM: patient  Chief Complaint  Patient presents with   Follow-up    Pt in room 2 alone. Here cpap follow up. Pt reports doing well on cpap machine.      HISTORY OF PRESENT ILLNESS:  01/09/24 ALL: Vincent Murray for follow up for OSA on CPAP. He continues to do well. He is now using a nasal pillow style mask and likes it much better. He uses therapy every day for about 7.5 hours, on average. He denies concerns with machine or supplies. He is considering a travel machine.     01/03/2023 ALL:  Vincent Murray Murray for follow up for OSA on CPAP. He reports doing well. He received a new machine in 09/2022. No sleep study was performed. He states that he is doing well with new machine. He prefers nasal pillow mask but has nasal irritation. Now using FFM.     01/03/2022 ALL: Vincent Murray Murray for follow up for OSA on CPAP. He continues to do well. He is using CPAp nightly. He can not sleep without it. He usually uses a full face mask but wishes to try nasal pillow as he has more congestion and feels the pressure of the mask around the nasal bridge contributes. He would also like a travel machine as he goes camping often.     08/01/2021 ALL:  Vincent Murray is a 38 y.o. male here today for follow up for OSA on CPAP.  He continues to do well with CPAP therapy.  He uses his machine every night.  He denies any difficulty with therapy or obtaining supplies.  He is feeling well today and without concerns.  Compliance report dated 11/27/2020 through 12/26/2020 reveals that he used CPAP 30 of the past 30 days for compliance of 100%.  4-hour compliance at 100%.  Average usage was 7 hours and 31 minutes.  Residual AHI was 1.2 on a set pressure of 10 cm of water and EPR of 3.  There was no significant leak noted.  HISTORY: (copied from previous note)  Vincent Murray is a very pleasant 38 year old right-handed gentleman  with an underlying medical history of hypertension, anxiety disorder, smoking, and obesity, who presents for follow-up consultation of his obstructive sleep apnea, after recent sleep study testing. The patient is unaccompanied today.  He presents after a longer gap of over 2 years, had canceled an appointment on 02/26/2018 with Vincent Murray, nurse practitioner.  I last saw him on 08/26/2017, at which time he was compliant with his CPAP and reported improved daytime somnolence, better sleep quality and sleep consolidation.  He had stopped smoking.  He had recently received an epidural steroid injection for low back pain under Vincent Murray.    Today, 12/22/2019: I reviewed his CPAP compliance data from 11/22/2019 through 12/21/2019 which is a total of 30 days, during which time he used his machine every night with percent use days greater than 4 hours at 93%, indicating excellent compliance with an average usage of 7 hours and 6 minutes, residual AHI at goal at 1.9/h, leak acceptable with a 95th percentile at 14.8 L/min on a pressure of 10 cm with EPR of 3.  He reports he needs new supplies, has been using a nasal mask.  He reports having had a job change and interim insurance change.  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  He has been compliant with treatment and continues to benefit from it, he generally speaking does not skip a night.   REVIEW OF SYSTEMS: Out of a complete 14 system review of symptoms, the patient complains only of the following symptoms, none and all other reviewed systems are negative.  ESS: 2  ALLERGIES: Allergies  Allergen Reactions   Gluten Meal    Penicillins Rash    REACTION: rash (ALL CILLINS)    HOME MEDICATIONS: Outpatient Medications Prior to Visit  Medication Sig Dispense Refill   azelastine  (ASTELIN ) 0.1 % nasal spray Place 1 spray into both nostrils at bedtime. Use in each  nostril as directed 30 mL 12   buPROPion  (WELLBUTRIN  XL) 150 MG 24 hr tablet Take 1 tablet (150 mg total) by mouth daily. 90 tablet 3   clobetasol  cream (TEMOVATE ) 0.05 % Apply 1 Application topically 2 (two) times daily. 45 g 3   lisinopril  (ZESTRIL ) 40 MG tablet Take 1 tablet (40 mg total) by mouth daily. 90 tablet 3   Multiple Vitamins-Minerals (MULTIVITAMIN MEN) TABS Take by mouth.     No facility-administered medications prior to visit.    PAST MEDICAL HISTORY: Past Medical History:  Diagnosis Date   Anxiety 2018   Hypertension    Smoker     PAST SURGICAL HISTORY: Past Surgical History:  Procedure Laterality Date   WISDOM TOOTH EXTRACTION      FAMILY HISTORY: Family History  Problem Relation Age of Onset   Stroke Maternal Grandfather    Heart disease Maternal Grandfather    Hypertension Other        runs in family   Hypertension Mother    Hypertension Father     SOCIAL HISTORY: Social History   Socioeconomic History   Marital status: Married    Spouse name: Megan    Number of children: 0   Years of education: HS   Highest education level: Some college, no degree  Occupational History   Occupation: Database Administrator  Tobacco Use   Smoking status: Every Day    Current packs/day: 0.00    Types: Cigarettes    Last attempt to quit: 06/30/2017    Years since quitting: 6.5   Smokeless tobacco: Former  Substance and Sexual Activity   Alcohol use: Yes    Alcohol/week: 0.0 standard drinks of alcohol    Comment: rare   Drug use: No   Sexual activity: Yes  Other Topics Concern   Not on file  Social History Narrative   ** Merged History Encounter **       Once a day caffeine use  R handed Lives home with wife   Social Drivers of Health   Financial Resource Strain: Low Risk  (11/21/2023)   Overall Financial Resource Strain (CARDIA)    Difficulty of Paying Living Expenses: Not hard at all  Food Insecurity: No Food Insecurity (11/21/2023)   Hunger Vital  Sign    Worried About Running Out of Food in the Last Year: Never true    Ran Out of Food in the Last Year: Never true  Transportation Needs: No Transportation Needs (11/21/2023)   PRAPARE - Administrator, Civil Service (Medical): No    Lack of Transportation (Non-Medical): No  Physical Activity: Sufficiently Active (11/21/2023)   Exercise Vital Sign    Days of Exercise per Week: 5 days    Minutes of Exercise per Session: 60 min  Stress: No Stress Concern  Present (11/21/2023)   [employer] of Occupational Health - Occupational Stress Questionnaire    Feeling of Stress : Not at all  Social Connections: Moderately Isolated (11/21/2023)   Social Connection and Isolation Panel [NHANES]    Frequency of Communication with Friends and Family: Twice a week    Frequency of Social Gatherings with Friends and Family: Once a week    Attends Religious Services: Never    Database Administrator or Organizations: No    Attends Engineer, Structural: Not on file    Marital Status: Married  Catering Manager Violence: Not on file     PHYSICAL EXAM  Vitals:   01/09/24 0927  BP: 125/76  Pulse: 99  Weight: 281 lb (127.5 kg)  Height: 6' (1.829 m)      Body mass index is 38.11 kg/m.  Generalized: Well developed, in no acute distress  Cardiology: normal rate and rhythm, no murmur noted Respiratory: clear to auscultation bilaterally  Neurological examination  Mentation: Alert oriented to time, place, history taking. Follows all commands speech and language fluent Cranial nerve II-XII: Pupils were equal round reactive to light. Extraocular movements were full, visual field were full  Motor: The motor testing reveals 5 over 5 strength of all 4 extremities. Good symmetric motor tone is noted throughout.  Gait and station: Gait is normal.    DIAGNOSTIC DATA (LABS, IMAGING, TESTING) - I reviewed patient records, labs, notes, testing and imaging myself where  available.      No data to display           Lab Results  Component Value Date   WBC 8.3 08/07/2023   HGB 13.4 08/07/2023   HCT 40.3 08/07/2023   MCV 85 08/07/2023   PLT 258 08/07/2023      Component Value Date/Time   NA 140 08/07/2023 0822   K 4.7 08/07/2023 0822   CL 102 08/07/2023 0822   CO2 23 08/07/2023 0822   GLUCOSE 92 08/07/2023 0822   GLUCOSE 123 (H) 02/19/2023 2036   BUN 18 08/07/2023 0822   CREATININE 0.92 08/07/2023 0822   CALCIUM 9.8 08/07/2023 0822   PROT 7.4 08/07/2023 0822   ALBUMIN 4.7 08/07/2023 0822   AST 19 08/07/2023 0822   ALT 19 08/07/2023 0822   ALKPHOS 64 08/07/2023 0822   BILITOT 0.5 08/07/2023 0822   GFRNONAA >60 02/19/2023 2036   Lab Results  Component Value Date   CHOL 194 08/07/2023   HDL 40 08/07/2023   LDLCALC 117 (H) 08/07/2023   TRIG 210 (H) 08/07/2023   CHOLHDL 4.9 08/07/2023   Lab Results  Component Value Date   HGBA1C 5.5 07/29/2023   No results found for: VITAMINB12 Lab Results  Component Value Date   TSH 1.16 03/31/2015     ASSESSMENT AND PLAN 38 y.o. year old male  has a past medical history of Anxiety (2018), Hypertension, and Smoker. here with     ICD-10-CM   1. OSA on CPAP  G47.33 For home use only DME continuous positive airway pressure (CPAP)      Brand A Vandyne is doing well on CPAP therapy. Compliance report reveals excellent complaince. He was encouraged to continue using CPAP nightly and for greater than 4 hours each night. We will update supply orders as indicated. Risks of untreated sleep apnea review and education materials provided. Healthy lifestyle habits encouraged. He will follow up in 1 year, sooner if needed. He verbalizes understanding and agreement with this plan.  Orders Placed This Encounter  Procedures   For home use only DME continuous positive airway pressure (CPAP)    Heated Humidity with all supplies as needed, patient using nasal pillow only    Length of Need:   Lifetime     Patient has OSA or probable OSA:   Yes    Is the patient currently using CPAP in the home:   Yes    Settings:   Other see comments    CPAP supplies needed:   Mask, headgear, cushions, filters, heated tubing and water chamber     No orders of the defined types were placed in this encounter.     Greig Forbes, FNP-C 01/09/2024, 10:01 AM [facility] [address], [address] [place], [place] [address] [phone]

## 2024-01-09 ENCOUNTER — Ambulatory Visit (INDEPENDENT_AMBULATORY_CARE_PROVIDER_SITE_OTHER): Payer: BC Managed Care – PPO | Admitting: Family Medicine

## 2024-01-09 ENCOUNTER — Encounter: Payer: Self-pay | Admitting: Family Medicine

## 2024-01-09 VITALS — BP 125/76 | HR 99 | Ht 72.0 in | Wt 281.0 lb

## 2024-01-09 DIAGNOSIS — G4733 Obstructive sleep apnea (adult) (pediatric): Secondary | ICD-10-CM

## 2024-02-06 ENCOUNTER — Encounter (INDEPENDENT_AMBULATORY_CARE_PROVIDER_SITE_OTHER): Payer: Self-pay | Admitting: Family Medicine

## 2024-02-06 ENCOUNTER — Ambulatory Visit (INDEPENDENT_AMBULATORY_CARE_PROVIDER_SITE_OTHER): Payer: BC Managed Care – PPO | Admitting: Family Medicine

## 2024-02-06 VITALS — BP 117/68 | HR 94 | Temp 98.1°F | Ht 71.5 in | Wt 274.0 lb

## 2024-02-06 DIAGNOSIS — E669 Obesity, unspecified: Secondary | ICD-10-CM | POA: Diagnosis not present

## 2024-02-06 DIAGNOSIS — G4733 Obstructive sleep apnea (adult) (pediatric): Secondary | ICD-10-CM

## 2024-02-06 DIAGNOSIS — R7303 Prediabetes: Secondary | ICD-10-CM | POA: Diagnosis not present

## 2024-02-06 DIAGNOSIS — Z6837 Body mass index (BMI) 37.0-37.9, adult: Secondary | ICD-10-CM | POA: Diagnosis not present

## 2024-02-06 NOTE — Progress Notes (Signed)
 Vincent DOROTHA Jenkins, DO, ABFM, ABOM Bariatric physician [address] [place], [place], [place] [address] Office: [phone]  /  Fax: [phone]    Initial Evaluation:  Vincent Murray was seen in clinic today to evaluate for obesity. He is interested in losing weight to improve overall health and reduce the risk of weight related complications. He presents today to review program treatment options, initial physical assessment, and evaluation.     He was referred by: PCP  When asked how has your weight affected you? He states: Contributed to medical problems, Having poor endurance, and Has affected mood   Contributing factors to his weight change: Family history of obesity on maternal side, Moderate to high levels of stress, Reduced physical activity, and Eating patterns  Some associated conditions: Hypertension, Hyperlipidemia, OSA, and Prediabetes  Current nutrition plan: Gluten Free, but no specific diet  Current level of physical activity: None  Current or previous pharmacotherapy: None  Response to medication: Never tried medications  @MEDCOMM @    Past Medical History:  Diagnosis Date   Anxiety 2018   Hypertension    Smoker      Objective:  BP 117/68   Pulse 94   Temp 98.1 F (36.7 C)   Ht 5' 11.5 (1.816 m)   Wt 274 lb (124.3 kg)   SpO2 97%   BMI 37.68 kg/m  He was weighed on the bioimpedance scale: Body mass index is 37.68 kg/m.  Visceral Fat %: 16, Body Fat %: 33.3%  No data recorded No data recorded   Vitals Temp: 98.1 F (36.7 C) BP: 117/68 Pulse Rate: 94 SpO2: 97 %   Anthropometric Measurements Height: 5' 11.5 (1.816 m) Weight: 274 lb (124.3 kg) BMI (Calculated): 37.69   Body Composition  Body Fat %: 33.3 % Fat Mass (lbs): 91.4 lbs Muscle Mass (lbs): 174.2 lbs Total Body Water (lbs): 124.2 lbs Visceral Fat Rating : 16   Other Clinical Data Comments: ino session   General: Well Developed, well nourished, and in no acute  distress.  HEENT: Normocephalic, atraumatic; EOMI, sclerae are anicteric. Skin: Warm and dry, good turgor Chest:  Normal excursion, shape, no gross ABN Respiratory: No conversational dyspnea; speaking in full sentences NeuroM-Sk:  Normal gross ROM * 4 extremities  Psych: A and O *3, insight adequate, mood- full    Assessment and Plan:   FOR THE DISEASE OF OBESITY:  Obesity (BMI 30-39.9) Assessment & Plan: We reviewed anthropometrics, biometrics, associated medical conditions and contributing factors with patient.   Vincent Murray would benefit from a medically tailored reduced calorie nutrional plan based on his REE (resting energy expenditure), which will be determined by indirect calorimetry.  We will also assess for cardiometabolic risk and nutritional derangements via fasting labs at intake appointment.    Obesity Treatment / Action Plan:   he was weighed on the bioimpedance scale and results were discussed and documented in the synopsis.   Vincent Murray will complete provided nutritional and psychosocial assessment questionnaire before the next appointment.  he will be scheduled for indirect calorimetry to determine resting energy expenditure in a fasting state.  This will allow us  to create a reduced calorie, high-protein meal plan to promote loss of fat mass while preserving muscle mass.  We will also assess for cardiometabolic risk and nutritional derangements via an ECG and fasting serologies at his next appointment.  he was encouraged to work on amassing support from family and friends to begin their weight loss journey.   Work on eliminating  or reducing the presence of highly processed, poorly nutritious, calorie-dense foods in the home.   Obesity Education Performed Today:  Patient was counseled on nutritional approaches to weight loss and benefits of reducing processed foods and consuming plant-based foods and high quality protein as part of nutritional weight  management program.   We discussed the importance of long term lifestyle changes which include nutrition, exercise and behavioral modifications as well as the importance of customizing this to his specific health and social needs.   We discussed the benefits of reaching a healthier weight to alleviate the symptoms of existing conditions and reduce the risks of the biomechanical, metabolic and psychological effects of obesity.  Was counseled on the health benefits of losing 5%-10% of total body weight.  Was counseled on our cognitive behavorial therapy program, lead by our bariatric psychologist, who focuses on emotional eating and creating positive behavorial change.  Was counseled on bariatric pharmacotherapy and how this may be used as an adjunct in their weight management   Vincent Murray appears to be in the action stage of change and states they are ready to start intensive lifestyle modifications and behavioral modifications.  It was recommended that he follow up in the next 1-2 weeks to review the above steps, and to continue with treatment of their chronic disease state of obesity  FOR OTHER CONDITIONS RELATED TO THE DISEASE OF OBESITY:   Prediabetes Assessment & Plan: Roughly 8 years ago, pt's Hemglobin A1c was 5.8 and consistent with the prediabetic range. His most recent A1c on 07/29/2023  was well controlled at 5.5. He has never been on any prediabetic medications. Diet/exercise has worked well for him.  Prediabetes may contribute to abnormal cravings, fatigue and diabetic complications without having diabetes. We will check fasting blood glucose and insulin levels with intake labs at next OV. He would benefit from a high protein low-carb meal plan.    Obstructive sleep apnea on CPAP Assessment & Plan: Condition managed by neurology. He endorses being diagnosed with OSA roughly 6 years ago. He received a new machine in 09/2022 and uses it every night. Continue all treatment per  neurology. Losing 10% or more of body weight may improve condition    Attestations:   Reviewed by clinician on day of visit: allergies, medications, problem list, medical history, surgical history, family history, social history, and previous encounter notes pertinent to obesity diagnosis. 41 minutes was spent today on this visit including the above counseling, pre-visit chart review, and post-visit documentation.  Over 50% of this time was spent in direct, face-to-face counseling and coordination of care  I, Special Derek , acting as a medical scribe for Vincent Jenkins, DO., have compiled all relevant documentation for today's office visit on behalf of Vincent Jenkins, DO, while in the presence of [employer], DO.  I have reviewed the above documentation for accuracy and completeness, and I agree with the above. Vincent JINNY Murray, D.O.  The 21st Century Cures Act was signed into law in 2016 which includes the topic of electronic health records.  This provides immediate access to information in MyChart.  This includes consultation notes, operative notes, office notes, lab results and pathology reports.  If you have any questions about what you read please let us  know at your next visit so we can discuss your concerns and take corrective action if need be.  We are right here with you!

## 2024-04-28 ENCOUNTER — Ambulatory Visit (INDEPENDENT_AMBULATORY_CARE_PROVIDER_SITE_OTHER): Payer: 59 | Admitting: Family Medicine

## 2024-04-28 ENCOUNTER — Encounter: Payer: Self-pay | Admitting: Family Medicine

## 2024-04-28 VITALS — BP 121/78 | HR 83 | Ht 71.5 in | Wt 278.1 lb

## 2024-04-28 DIAGNOSIS — F419 Anxiety disorder, unspecified: Secondary | ICD-10-CM | POA: Diagnosis not present

## 2024-04-28 DIAGNOSIS — E669 Obesity, unspecified: Secondary | ICD-10-CM

## 2024-04-28 NOTE — Progress Notes (Signed)
   Established Patient Office Visit  Subjective   Patient ID: Vincent Murray, male    DOB: 05/31/1986  Age: 38 y.o. MRN: [id number]  Chief Complaint  Patient presents with   Medical Management of Chronic Issues    HPI  Subjective: - Follow-up appointment for weight management. Recently met with weight loss specialist (Dr. Carolene Chute). - Reports feeling pulse/throbbing sensation in body when lying down or sitting. BP checked during episodes: 140/85-90, occasionally 150s. - Concerned about medication availability due to political climate. - Lost dog in January, experienced period of emotional eating through February. Recently got new dog (73-month-old pit mix).  PMHx: Hypertension, requires medication management.  Medications:  - Lisinopril  (for hypertension) - Wellbutrin  XL 150mg  daily (bupropion )  Social history:  - Reports high healthcare costs ($225 per visit) - Recently lost dog in January, got new dog  The ASCVD Risk score (Arnett DK, et al., 2019) failed to calculate for the following reasons:   The 2019 ASCVD risk score is only valid for ages 27 to 73  Health Maintenance Due  Topic Date Due   Hepatitis C Screening  Never done   Pneumococcal Vaccine 20-22 Years old (1 of 2 - PCV) Never done   COVID-19 Vaccine (3 - 2024-25 season) 09/01/2023      Objective:     BP 121/78   Pulse 83   Ht 5' 11.5" (1.816 m)   Wt 278 lb 1.9 oz (126.2 kg)   SpO2 99%   BMI 38.25 kg/m    Physical Exam Gen: alert, oriented Pulm: lctab Psych: pleasant affect.    No results found for any visits on 04/28/24.      Assessment & Plan:   Obesity (BMI 30-39.9) Assessment & Plan: Continue to f/u with [facility]     Anxiety Assessment & Plan: Continue bupropion  150mg . Discussed titrating if needed.       Return in about 6 months (around 10/28/2024) for physical.    Laneta Pintos, MD

## 2024-04-28 NOTE — Patient Instructions (Signed)
 It was nice to see you today,  We addressed the following topics today: -I do not think we need to change any medications at this time.  If you would like to increase your bupropion  at any time the next highest dose would be 300 mg which is 2 of your current tablets.  You can try titrating on your own or I can send in a newer prescription in that case - When you need refills of your medicines I will send them in.  You do not need to see me again until 6 months from now for your physical. - Because you are paying so much money and co-pays for these visits, it may be cheaper to do something like direct primary care.  This is a monthly subscription based primary care provider and generally you would pay about $60 a month per person and minimal fees for labs, medications (most generic medications) and visits.  Dr. Raymona Caldwell is a direct primary care provider in [place] but there are others.  Have a great day,  Etha Henle, MD

## 2024-05-03 NOTE — Assessment & Plan Note (Signed)
 Continue bupropion  150mg . Discussed titrating if needed.

## 2024-05-03 NOTE — Assessment & Plan Note (Signed)
 Continue to f/u with [facility].

## 2024-05-05 ENCOUNTER — Telehealth: Payer: Self-pay | Admitting: Family Medicine

## 2024-05-05 NOTE — Telephone Encounter (Signed)
 Inquiry of how to access My chart

## 2024-07-06 ENCOUNTER — Other Ambulatory Visit: Payer: Self-pay | Admitting: Family Medicine

## 2024-07-06 DIAGNOSIS — F419 Anxiety disorder, unspecified: Secondary | ICD-10-CM

## 2024-09-07 ENCOUNTER — Ambulatory Visit: Admitting: Dermatology

## 2024-09-07 ENCOUNTER — Encounter: Payer: Self-pay | Admitting: Dermatology

## 2024-09-07 ENCOUNTER — Ambulatory Visit (INDEPENDENT_AMBULATORY_CARE_PROVIDER_SITE_OTHER): Admitting: Dermatology

## 2024-09-07 VITALS — BP 128/76

## 2024-09-07 DIAGNOSIS — L578 Other skin changes due to chronic exposure to nonionizing radiation: Secondary | ICD-10-CM

## 2024-09-07 DIAGNOSIS — D485 Neoplasm of uncertain behavior of skin: Secondary | ICD-10-CM | POA: Diagnosis not present

## 2024-09-07 DIAGNOSIS — W908XXA Exposure to other nonionizing radiation, initial encounter: Secondary | ICD-10-CM

## 2024-09-07 DIAGNOSIS — L814 Other melanin hyperpigmentation: Secondary | ICD-10-CM | POA: Diagnosis not present

## 2024-09-07 DIAGNOSIS — D1801 Hemangioma of skin and subcutaneous tissue: Secondary | ICD-10-CM

## 2024-09-07 DIAGNOSIS — L821 Other seborrheic keratosis: Secondary | ICD-10-CM | POA: Diagnosis not present

## 2024-09-07 DIAGNOSIS — D224 Melanocytic nevi of scalp and neck: Secondary | ICD-10-CM | POA: Diagnosis not present

## 2024-09-07 DIAGNOSIS — Z1283 Encounter for screening for malignant neoplasm of skin: Secondary | ICD-10-CM

## 2024-09-07 DIAGNOSIS — D229 Melanocytic nevi, unspecified: Secondary | ICD-10-CM

## 2024-09-07 NOTE — Patient Instructions (Addendum)
 Date: Mon Sep 07 2024  Hello Vincent Murray,  Thank you for visiting today. Here is a summary of the key instructions:  - Wound Care:   - Clean the biopsy site with soap and water in the shower   - Apply a generous amount of Aquaphor to the biopsy site   - Cover the biopsy site with a Band-Aid   - Continue this care for about 2 weeks until the site heals  - Follow-up:   - Check MyChart for biopsy results in about a week   - Schedule a follow-up appointment in one year for a waist-up skin exam  - Optional Procedures:   - Schedule an appointment for skin tag removal if desired   - $200 out-of-pocket cost for skin tag removal   - $100 deposit required to schedule skin tag removal appointment  - Other Instructions:   - Continue efforts to reduce sun exposure at work  Please reach out if you have any questions or concerns.  Warm regards,  Dr. [facility]   Cosmetic Removal Quote:  $200 to remove up to 15 lesions $300 to remove 16 to 25 lesions $400 to removal 26 to 35 lesions $10 per each additional lesion over 35  ** $100 deposit required to schedule **    Patient Handout: Wound Care for Skin Biopsy Site  Taking Care of Your Skin Biopsy Site  Proper care of the biopsy site is essential for promoting healing and minimizing scarring. This handout provides instructions on how to care for your biopsy site to ensure optimal recovery.  1. Cleaning the Wound:  Clean the biopsy site daily with gentle soap and water. Gently pat the area dry with a clean, soft towel. Avoid harsh scrubbing or rubbing the area, as this can irritate the skin and delay healing.  2. Applying Aquaphor and Bandage:  After cleaning the wound, apply a thin layer of Aquaphor ointment to the biopsy site. Cover the area with a sterile bandage to protect it from dirt, bacteria, and friction. Change the bandage daily or as needed if it becomes soiled or wet.  3. Continued Care for One  Week:  Repeat the cleaning, Aquaphor application, and bandaging process daily for one week following the biopsy procedure. Keeping the wound clean and moist during this initial healing period will help prevent infection and promote optimal healing.  4. Massaging Aquaphor into the Area:  ---After one week, discontinue the use of bandages but continue to apply Aquaphor to the biopsy site. ----Gently massage the Aquaphor into the area using circular motions. ---Massaging the skin helps to promote circulation and prevent the formation of scar tissue.   Additional Tips:  Avoid exposing the biopsy site to direct sunlight during the healing process, as this can cause hyperpigmentation or worsen scarring. If you experience any signs of infection, such as increased redness, swelling, warmth, or drainage from the wound, contact your healthcare provider immediately. Follow any additional instructions provided by your healthcare provider for caring for the biopsy site and managing any discomfort. Conclusion:  Taking proper care of your skin biopsy site is crucial for ensuring optimal healing and minimizing scarring. By following these instructions for cleaning, applying Aquaphor, and massaging the area, you can promote a smooth and successful recovery. If you have any questions or concerns about caring for your biopsy site, don't hesitate to contact your healthcare provider for guidance.     Important Information  Due to recent changes in healthcare laws, you may  see results of your pathology and/or laboratory studies on MyChart before the doctors have had a chance to review them. We understand that in some cases there may be results that are confusing or concerning to you. Please understand that not all results are received at the same time and often the doctors may need to interpret multiple results in order to provide you with the best plan of care or course of treatment. Therefore, we ask that you  please give us  2 business days to thoroughly review all your results before contacting the office for clarification. Should we see a critical lab result, you will be contacted sooner.   If You Need Anything After Your Visit  If you have any questions or concerns for your doctor, please call our main line at [phone] If no one answers, please leave a voicemail as directed and we will return your call as soon as possible. Messages left after 4 pm will be answered the following business day.   You may also send us  a message via MyChart. We typically respond to MyChart messages within 1-2 business days.  For prescription refills, please ask your pharmacy to contact our office. Our fax number is [phone].  If you have an urgent issue when the clinic is closed that cannot wait until the next business day, you can page your doctor at the number below.    Please note that while we do our best to be available for urgent issues outside of office hours, we are not available 24/7.   If you have an urgent issue and are unable to reach us , you may choose to seek medical care at your doctor's office, retail clinic, urgent care center, or emergency room.  If you have a medical emergency, please immediately call 911 or go to the emergency department. In the event of inclement weather, please call our main line at [phone] for an update on the status of any delays or closures.  Dermatology Medication Tips: Please keep the boxes that topical medications come in in order to help keep track of the instructions about where and how to use these. Pharmacies typically print the medication instructions only on the boxes and not directly on the medication tubes.   If your medication is too expensive, please contact our office at [phone] or send us  a message through MyChart.   We are unable to tell what your co-pay for medications will be in advance as this is different depending on your insurance  coverage. However, we may be able to find a substitute medication at lower cost or fill out paperwork to get insurance to cover a needed medication.   If a prior authorization is required to get your medication covered by your insurance company, please allow us  1-2 business days to complete this process.  Drug prices often vary depending on where the prescription is filled and some pharmacies may offer cheaper prices.  The website www.goodrx.com contains coupons for medications through different pharmacies. The prices here do not account for what the cost may be with help from insurance (it may be cheaper with your insurance), but the website can give you the price if you did not use any insurance.  - You can print the associated coupon and take it with your prescription to the pharmacy.  - You may also stop by our office during regular business hours and pick up a GoodRx coupon card.  - If you need your prescription sent electronically to a different pharmacy, notify  our office through [facility] or by phone at [phone]

## 2024-09-07 NOTE — Progress Notes (Signed)
   New Patient Visit   Subjective  Vincent Murray is a 38 y.o. male who presents for a NEW PATIENT appointment to be examined for the concerns as listed below.   Spot Check: Located at the L shoulder. His wife noticed it last year and would like for it to be evaluated.    Skin Tags:  Pt has several irritated skin tags that he would removed as that rubs or gets hung on his shirts.    Patient denied Hx of Bx. Patient reports family Hx of skin cancer (maternal grandfather - unsure of type).   The following portions of the chart were reviewed this encounter and updated as appropriate: medications, allergies, medical history  Review of Systems:  No other skin or systemic complaints except as noted in HPI or Assessment and Plan.  Objective  Well appearing patient in no apparent distress; mood and affect are within normal limits.   All skin waist up examined.    Relevant exam findings are noted in the Assessment and Plan.      Neck - Anterior 1cm x 5mm brown macule  Assessment & Plan   LENTIGINES, SEBORRHEIC KERATOSES, HEMANGIOMAS - Benign normal skin lesions - Benign-appearing - Call for any changes  MELANOCYTIC NEVI - Tan-brown and/or pink-flesh-colored symmetric macules and papules - Benign appearing on exam today - Observation - Call clinic for new or changing moles - Recommend daily use of broad spectrum spf 30+ sunscreen to sun-exposed areas.   ACTINIC DAMAGE - Chronic condition, secondary to cumulative UV/sun exposure - diffuse scaly erythematous macules with underlying dyspigmentation - Recommend daily broad spectrum sunscreen SPF 30+ to sun-exposed areas, reapply every 2 hours as needed.  - Staying in the shade or wearing long sleeves, sun glasses (UVA+UVB protection) and wide brim hats (4-inch brim around the entire circumference of the hat) are also recommended for sun protection.  - Call for new or changing lesions.  SKIN CANCER SCREENING PERFORMED  TODAY  NEOPLASM OF UNCERTAIN BEHAVIOR OF SKIN Neck - Anterior Skin / nail biopsy Type of biopsy: tangential   Informed consent: discussed and consent obtained   Timeout: patient name, date of birth, surgical site, and procedure verified   Procedure prep:  Patient was prepped and draped in usual sterile fashion Prep type:  Isopropyl alcohol Anesthesia: the lesion was anesthetized in a standard fashion   Anesthetic:  1% lidocaine w/ epinephrine 1-100,000 buffered w/ 8.4% NaHCO3 Instrument used: DermaBlade   Hemostasis achieved with: aluminum chloride   Outcome: patient tolerated procedure well   Post-procedure details: sterile dressing applied and wound care instructions given   Dressing type: petrolatum gauze and bandage    Specimen 1 - Surgical pathology Differential Diagnosis: r/o DN  Check Margins: yes  Return in about 1 year (around 09/07/2025) for TBSE.   Documentation: I have reviewed the above documentation for accuracy and completeness, and I agree with the above.  I, Shirron Maranda, CMA, am acting as scribe for [employer], DO.   Delon Lenis, DO

## 2024-09-08 LAB — SURGICAL PATHOLOGY

## 2024-09-09 ENCOUNTER — Ambulatory Visit: Payer: Self-pay | Admitting: Dermatology

## 2024-09-20 ENCOUNTER — Other Ambulatory Visit: Payer: Self-pay | Admitting: Family Medicine

## 2024-09-20 DIAGNOSIS — I10 Essential (primary) hypertension: Secondary | ICD-10-CM

## 2024-10-19 ENCOUNTER — Other Ambulatory Visit: Payer: Self-pay | Admitting: *Deleted

## 2024-10-19 DIAGNOSIS — E669 Obesity, unspecified: Secondary | ICD-10-CM

## 2024-10-19 DIAGNOSIS — I10 Essential (primary) hypertension: Secondary | ICD-10-CM

## 2024-10-27 ENCOUNTER — Other Ambulatory Visit

## 2024-10-27 DIAGNOSIS — E669 Obesity, unspecified: Secondary | ICD-10-CM

## 2024-10-27 DIAGNOSIS — I10 Essential (primary) hypertension: Secondary | ICD-10-CM

## 2024-10-28 ENCOUNTER — Ambulatory Visit: Payer: Self-pay | Admitting: Family Medicine

## 2024-10-28 LAB — COMPREHENSIVE METABOLIC PANEL WITH GFR
ALT: 16 IU/L (ref 0–44)
AST: 22 IU/L (ref 0–40)
Albumin: 5 g/dL (ref 4.1–5.1)
Alkaline Phosphatase: 65 IU/L (ref 47–123)
BUN/Creatinine Ratio: 17 (ref 9–20)
BUN: 19 mg/dL (ref 6–20)
Bilirubin Total: 0.6 mg/dL (ref 0.0–1.2)
CO2: 22 mmol/L (ref 20–29)
Calcium: 9.7 mg/dL (ref 8.7–10.2)
Chloride: 100 mmol/L (ref 96–106)
Creatinine, Ser: 1.12 mg/dL (ref 0.76–1.27)
Globulin, Total: 2.9 g/dL (ref 1.5–4.5)
Glucose: 82 mg/dL (ref 70–99)
Potassium: 4.6 mmol/L (ref 3.5–5.2)
Sodium: 137 mmol/L (ref 134–144)
Total Protein: 7.9 g/dL (ref 6.0–8.5)
eGFR: 87 mL/min/1.73 (ref >59)

## 2024-10-28 LAB — HEMOGLOBIN A1C
Est. average glucose Bld gHb Est-mCnc: 111 mg/dL
Hgb A1c MFr Bld: 5.5 % (ref 4.8–5.6)

## 2024-10-28 LAB — LIPID PANEL
Chol/HDL Ratio: 6 ratio — ABNORMAL HIGH (ref 0.0–5.0)
Cholesterol, Total: 205 mg/dL — ABNORMAL HIGH (ref 100–199)
HDL: 34 mg/dL — ABNORMAL LOW (ref >39)
LDL Chol Calc (NIH): 131 mg/dL — ABNORMAL HIGH (ref 0–99)
Triglycerides: 221 mg/dL — ABNORMAL HIGH (ref 0–149)
VLDL Cholesterol Cal: 40 mg/dL (ref 5–40)

## 2024-11-09 ENCOUNTER — Ambulatory Visit (INDEPENDENT_AMBULATORY_CARE_PROVIDER_SITE_OTHER): Admitting: Family Medicine

## 2024-11-09 ENCOUNTER — Encounter: Payer: Self-pay | Admitting: Family Medicine

## 2024-11-09 VITALS — BP 113/72 | HR 83 | Ht 71.5 in | Wt 270.0 lb

## 2024-11-09 DIAGNOSIS — R52 Pain, unspecified: Secondary | ICD-10-CM | POA: Diagnosis not present

## 2024-11-09 DIAGNOSIS — E782 Mixed hyperlipidemia: Secondary | ICD-10-CM | POA: Diagnosis not present

## 2024-11-09 DIAGNOSIS — E669 Obesity, unspecified: Secondary | ICD-10-CM | POA: Diagnosis not present

## 2024-11-09 DIAGNOSIS — I10 Essential (primary) hypertension: Secondary | ICD-10-CM | POA: Diagnosis not present

## 2024-11-09 DIAGNOSIS — L720 Epidermal cyst: Secondary | ICD-10-CM | POA: Diagnosis not present

## 2024-11-09 DIAGNOSIS — Z0000 Encounter for general adult medical examination without abnormal findings: Secondary | ICD-10-CM

## 2024-11-09 DIAGNOSIS — E785 Hyperlipidemia, unspecified: Secondary | ICD-10-CM | POA: Insufficient documentation

## 2024-11-09 DIAGNOSIS — F419 Anxiety disorder, unspecified: Secondary | ICD-10-CM

## 2024-11-09 MED ORDER — BUPROPION HCL ER (XL) 150 MG PO TB24: 150.0000 mg | ORAL_TABLET | Freq: Every day | ORAL | 3 refills | Status: AC

## 2024-11-09 MED ORDER — LISINOPRIL 40 MG PO TABS: 40.0000 mg | ORAL_TABLET | Freq: Every day | ORAL | 3 refills | Status: AC

## 2024-11-09 NOTE — Assessment & Plan Note (Signed)
 Likely a sebaceous cyst that was previously inflamed. Currently, it is firm, non-tender, and non-infected. No indication for incision and drainage or antibiotics at this time. - Advised to apply warm compresses. - Instructed to avoid manipulating or squeezing the lesion. - RTC if it becomes larger, painful, or shows signs of infection. Can schedule a dedicated appointment for excision if desired in the future.

## 2024-11-09 NOTE — Assessment & Plan Note (Signed)
 Patient reports a nightly throbbing sensation in his back and ears, with normal home BPs. Etiology is unclear. - Labs ordered today to evaluate for secondary causes, including cortisol, thyroid  function, and pheochromocytoma screen. - Plan to review lab results. If normal, will consider further imaging (e.g., CT, ultrasound) to evaluate for a vascular cause.

## 2024-11-09 NOTE — Patient Instructions (Signed)
 It was nice to see you today,  We addressed the following topics today: - I would like you to track your food and drink intake for two weeks using an app like MyFitnessPal or Lose It. Just eat normally, but be as accurate as possible. This will give us  a baseline of your average daily calorie intake. - After two weeks, subtract 500 calories from your daily average and aim for that new, lower calorie goal each day. - Try to reduce your intake of saturated fats, which are found in red meat (beef, pork), sausage, bacon, butter, and cheese. Venison is a healthier option. - Consider adding a fiber supplement like Metamucil to your daily routine. This can help with cholesterol and feeling full. - Apply warm compresses to the spot on your back. Do not try to squeeze it anymore. If it gets larger, more painful, red, or warm, please contact the office. - We will recheck your cholesterol with a lab-only visit in six months. We will schedule your next full physical for one year from now. - Please get the bloodwork done today. We will discuss the results and next steps once they are available.  Have a great day,  Rolan Slain, MD

## 2024-11-09 NOTE — Progress Notes (Signed)
 Annual physical  Subjective   Patient ID: Vincent Murray, male    DOB: June 26, 1986  Age: 38 y.o. MRN: [id number]  Chief Complaint  Patient presents with   Annual Exam   HPI Vincent Murray is a 38 y.o. old male here  for annual exam.   Subjective - New boil on back, appeared last week. Was tender and larger, now smaller and less sensitive. Attempted self-expression with minimal drainage. Became very inflamed for one day then improved. No fever. - Elevated cholesterol noted on recent labs. - Reports a persistent throbbing or pulsating sensation, felt in the back and sometimes up to the ears. Occurs almost every night, typically after dinner while relaxing. Not associated with a ringing sound. Reports rare, brief episodes of feeling an acceleration and a numb sensation over the past 5 years. BP readings at home during episodes were normal: 130/71, 126/72, 124/76.  Medications Bupropion  taken daily. Lisinopril  is prescribed but not taken regularly.  PMH, PSH, FH, Social Hx PMHx: Hyperlipidemia. History of dysplastic nevus removed from back by dermatology (Dr. Delon Lenis) this year; pathology showed abnormalities but no malignancy. PSH: Excision of dysplastic nevus. FH: Maternal grandfather and great-grandfather had strokes. Maternal grandmother and grandfather had heart attacks. Both grandfathers were long-term smokers. Social Hx: Former smoker, quit a few years ago. Reports significant financial concerns regarding co-insurance costs for visits, paying approximately $240 per visit. Discussed direct primary care models. Reports feeling tired and unmotivated to exercise.  ROS Integumentary: Positive for new lesion on back. Cardiovascular: Positive for sensation of palpitations/pulsations. Denies chest pain. Constitutional: Positive for 8lb weight loss over 6 months. Positive for hunger despite dietary changes.    The ASCVD Risk score (Arnett DK, et al., 2019) failed to  calculate for the following reasons:   The 2019 ASCVD risk score is only valid for ages 37 to 31  Health Maintenance Due  Topic Date Due   Hepatitis C Screening  Never done   Pneumococcal Vaccine (1 of 2 - PCV) Never done   Hepatitis B Vaccines 19-59 Average Risk (1 of 3 - 19+ 3-dose series) Never done   HPV VACCINES (1 - Risk 3-dose SCDM series) Never done   COVID-19 Vaccine (3 - Pfizer risk series) 05/02/2020      Objective:     BP 113/72   Pulse 83   Ht 5' 11.5 (1.816 m)   Wt 270 lb (122.5 kg)   SpO2 97%   BMI 37.13 kg/m    Physical Exam Gen: alert, oriented HEENT: perrla, eomi, mmm CV: rrr, no murmur Pulm: lctab. No wheeze or crackles.  GI: soft, nbs.  Nontender to palpation MSK: strength equal b/l. Normal gait Ext: no pedal edema SKIN: Firm, non-fluctuant, mildly-erythematous, non-tender nodule on the right sided low back. No signs of acute infection or abscess. Psych: pleasant affect.  Spontaneous speech   No results found for any visits on 11/09/24.      Assessment & Plan:   Physical exam, annual  Epidermal cyst Assessment & Plan: Likely a sebaceous cyst that was previously inflamed. Currently, it is firm, non-tender, and non-infected. No indication for incision and drainage or antibiotics at this time. - Advised to apply warm compresses. - Instructed to avoid manipulating or squeezing the lesion. - RTC if it becomes larger, painful, or shows signs of infection. Can schedule a dedicated appointment for excision if desired in the future.   Moderate mixed hyperlipidemia not requiring statin therapy Assessment & Plan: LDL is 130, slightly  increased from previous labs. This is likely secondary to a diet high in saturated fats (red meat, pork, sausage, bacon, eggs, cheese). Discussed non-pharmacological management for cholesterol and weight loss. - Continue dietary modifications with an emphasis on reducing saturated fat intake (red meat, dairy, fatty  meats). - Counseled on using a calorie-tracking application (e.g., Lose It, MyFitnessPal) to track intake for 2 weeks to establish a baseline, then reduce daily intake by 500 calories. - Advised on adding a fiber supplement, specifically Metamucil, for cholesterol reduction and satiety. - Discussed other supplements like CoQ10. - Recheck lipid panel in 6 months via a lab-only visit.   Throbbing pain Assessment & Plan:  Patient reports a nightly throbbing sensation in his back and ears, with normal home BPs. Etiology is unclear. - Labs ordered today to evaluate for secondary causes, including cortisol, thyroid  function, and pheochromocytoma screen. - Plan to review lab results. If normal, will consider further imaging (e.g., CT, ultrasound) to evaluate for a vascular cause.   Obesity (BMI 30-39.9) -     Metanephrines, plasma -     Cortisol -     TSH -     ACTH  Essential hypertension -     Lisinopril ; Take 1 tablet (40 mg total) by mouth daily.  Dispense: 90 tablet; Refill: 3 -     Metanephrines, plasma -     Cortisol -     TSH -     ACTH  Anxiety -     buPROPion  HCl ER (XL); Take 1 tablet (150 mg total) by mouth daily.  Dispense: 90 tablet; Refill: 3 -     Metanephrines, plasma -     Cortisol -     TSH -     ACTH     Return in about 1 year (around 11/09/2025) for physical.    Toribio MARLA Slain, MD

## 2024-11-09 NOTE — Assessment & Plan Note (Signed)
 LDL is 130, slightly increased from previous labs. This is likely secondary to a diet high in saturated fats (red meat, pork, sausage, bacon, eggs, cheese). Discussed non-pharmacological management for cholesterol and weight loss. - Continue dietary modifications with an emphasis on reducing saturated fat intake (red meat, dairy, fatty meats). - Counseled on using a calorie-tracking application (e.g., Lose It, MyFitnessPal) to track intake for 2 weeks to establish a baseline, then reduce daily intake by 500 calories. - Advised on adding a fiber supplement, specifically Metamucil, for cholesterol reduction and satiety. - Discussed other supplements like CoQ10. - Recheck lipid panel in 6 months via a lab-only visit.

## 2024-11-12 LAB — METANEPHRINES, PLASMA
Metanephrine, Free: <25 pg/mL (ref 0.0–88.0)
Normetanephrine, Free: 32.9 pg/mL (ref 0.0–210.1)

## 2024-11-12 LAB — TSH: TSH: 0.924 u[IU]/mL (ref 0.450–4.500)

## 2024-11-12 LAB — ACTH: ACTH: 14.3 pg/mL (ref 7.2–63.3)

## 2024-11-12 LAB — CORTISOL: Cortisol: 5 ug/dL — ABNORMAL LOW (ref 6.2–19.4)

## 2024-11-23 ENCOUNTER — Ambulatory Visit: Payer: Self-pay | Admitting: Family Medicine

## 2024-11-24 NOTE — Progress Notes (Signed)
 Called patient LVM to contact the office if pt calls please advised

## 2024-11-25 ENCOUNTER — Other Ambulatory Visit: Payer: Self-pay | Admitting: Family Medicine

## 2024-11-25 DIAGNOSIS — H93A9 Pulsatile tinnitus, unspecified ear: Secondary | ICD-10-CM

## 2024-11-25 NOTE — Progress Notes (Signed)
 Called patient he is agreeable to the CT scan

## 2024-12-01 ENCOUNTER — Telehealth: Payer: Self-pay

## 2024-12-01 NOTE — Telephone Encounter (Signed)
 Patient is scheduled for CT on 12/24  Auth completed and information added to the appt desk.   CPT [id number] # [id number] (valid 12/2-12/31) CPT 29501 # [id number] (valid 12/2-12/31)   Nothing further needed.

## 2024-12-01 NOTE — Telephone Encounter (Signed)
 Copied from CRM #[id number]. Topic: General - Call Back - No Documentation >> Dec 01, 2024  8:50 AM Tonda B wrote: Reason for CRM: [facility] is calling asking for a prior authorization for pt appt with them please call [phone] ask for bonnie

## 2024-12-03 ENCOUNTER — Inpatient Hospital Stay: Admission: RE | Admit: 2024-12-03 | Discharge: 2024-12-03 | Attending: Family Medicine

## 2024-12-03 DIAGNOSIS — H93A9 Pulsatile tinnitus, unspecified ear: Secondary | ICD-10-CM

## 2024-12-03 MED ORDER — IOPAMIDOL (ISOVUE-370) INJECTION 76%
80.0000 mL | Freq: Once | INTRAVENOUS | Status: AC | PRN
  Administered 2024-12-03: 80 mL via INTRAVENOUS

## 2024-12-09 ENCOUNTER — Ambulatory Visit: Payer: Self-pay | Admitting: Family Medicine

## 2025-01-14 ENCOUNTER — Ambulatory Visit: Payer: BC Managed Care – PPO | Admitting: Family Medicine

## 2025-04-06 ENCOUNTER — Ambulatory Visit: Admitting: Family Medicine

## 2025-05-10 ENCOUNTER — Other Ambulatory Visit

## 2025-09-08 ENCOUNTER — Ambulatory Visit: Admitting: Dermatology

## 2025-11-03 ENCOUNTER — Other Ambulatory Visit

## 2025-11-10 ENCOUNTER — Encounter: Admitting: Family Medicine
